# Patient Record
Sex: Male | Born: 1960
Health system: Southern US, Community
[De-identification: ages and names within clinical notes are randomized; demographics above are authoritative.]

## PROBLEM LIST (undated history)

## (undated) DIAGNOSIS — M1990 Unspecified osteoarthritis, unspecified site: Secondary | ICD-10-CM

## (undated) DIAGNOSIS — K4020 Bilateral inguinal hernia, without obstruction or gangrene, not specified as recurrent: Secondary | ICD-10-CM

## (undated) DIAGNOSIS — N529 Male erectile dysfunction, unspecified: Secondary | ICD-10-CM

## (undated) HISTORY — PX: KNEE SURGERY: SHX244

---

## 2016-12-22 DIAGNOSIS — I214 Non-ST elevation (NSTEMI) myocardial infarction: Secondary | ICD-10-CM

## 2016-12-22 HISTORY — DX: Non-ST elevation (NSTEMI) myocardial infarction: I21.4

## 2016-12-31 DIAGNOSIS — I10 Essential (primary) hypertension: Secondary | ICD-10-CM | POA: Insufficient documentation

## 2016-12-31 DIAGNOSIS — E782 Mixed hyperlipidemia: Secondary | ICD-10-CM | POA: Diagnosis not present

## 2016-12-31 DIAGNOSIS — Z7689 Persons encountering health services in other specified circumstances: Secondary | ICD-10-CM | POA: Diagnosis not present

## 2016-12-31 DIAGNOSIS — I2129 ST elevation (STEMI) myocardial infarction involving other sites: Secondary | ICD-10-CM | POA: Insufficient documentation

## 2016-12-31 DIAGNOSIS — I2510 Atherosclerotic heart disease of native coronary artery without angina pectoris: Secondary | ICD-10-CM

## 2016-12-31 HISTORY — DX: Essential (primary) hypertension: I10

## 2016-12-31 HISTORY — DX: ST elevation (STEMI) myocardial infarction involving other sites: I21.29

## 2016-12-31 HISTORY — DX: Atherosclerotic heart disease of native coronary artery without angina pectoris: I25.10

## 2016-12-31 HISTORY — DX: Mixed hyperlipidemia: E78.2

## 2017-01-28 DIAGNOSIS — I2510 Atherosclerotic heart disease of native coronary artery without angina pectoris: Secondary | ICD-10-CM | POA: Diagnosis not present

## 2017-01-28 DIAGNOSIS — E782 Mixed hyperlipidemia: Secondary | ICD-10-CM | POA: Diagnosis not present

## 2017-01-28 DIAGNOSIS — I10 Essential (primary) hypertension: Secondary | ICD-10-CM | POA: Diagnosis not present

## 2017-04-28 DIAGNOSIS — Z0000 Encounter for general adult medical examination without abnormal findings: Secondary | ICD-10-CM | POA: Diagnosis not present

## 2017-04-28 DIAGNOSIS — I10 Essential (primary) hypertension: Secondary | ICD-10-CM | POA: Diagnosis not present

## 2017-04-28 DIAGNOSIS — I2510 Atherosclerotic heart disease of native coronary artery without angina pectoris: Secondary | ICD-10-CM | POA: Diagnosis not present

## 2017-05-08 DIAGNOSIS — I10 Essential (primary) hypertension: Secondary | ICD-10-CM | POA: Diagnosis not present

## 2017-05-08 DIAGNOSIS — Z79899 Other long term (current) drug therapy: Secondary | ICD-10-CM | POA: Diagnosis not present

## 2017-05-26 DIAGNOSIS — R001 Bradycardia, unspecified: Secondary | ICD-10-CM | POA: Diagnosis not present

## 2017-05-26 DIAGNOSIS — I2510 Atherosclerotic heart disease of native coronary artery without angina pectoris: Secondary | ICD-10-CM | POA: Diagnosis not present

## 2017-05-26 DIAGNOSIS — E782 Mixed hyperlipidemia: Secondary | ICD-10-CM | POA: Diagnosis not present

## 2017-05-26 HISTORY — DX: Bradycardia, unspecified: R00.1

## 2017-07-20 ENCOUNTER — Encounter: Payer: Self-pay | Admitting: *Deleted

## 2017-07-20 ENCOUNTER — Encounter: Payer: 59 | Attending: Internal Medicine | Admitting: *Deleted

## 2017-07-20 VITALS — Ht 69.7 in | Wt 197.6 lb

## 2017-07-20 DIAGNOSIS — I252 Old myocardial infarction: Secondary | ICD-10-CM | POA: Diagnosis present

## 2017-07-20 DIAGNOSIS — I214 Non-ST elevation (NSTEMI) myocardial infarction: Secondary | ICD-10-CM

## 2017-07-20 DIAGNOSIS — I219 Acute myocardial infarction, unspecified: Secondary | ICD-10-CM

## 2017-07-20 HISTORY — DX: Acute myocardial infarction, unspecified: I21.9

## 2017-07-20 NOTE — Progress Notes (Signed)
Daily Session Note  Patient Details  Name: Adam Cline MRN: [id number] Date of Birth: February 07, 1961 Referring Provider:  Serafina Royals,  MD  Encounter Date: 07/20/2017  Check In:     Session Check In - 07/20/17 1237      Check-In   Location [facility]Cardiac & Pul[facility]   Staff Present Heath Lark, RN, BSN, Gordy Councilman, MA, ACSM RCEP, Exercise Physiologist;Shiryl Ruddy, RN, BSN   Supervising physician immediately available to respond to emergencies See telemetry face sheet for immediately available ER MD   Medication changes reported     No   Fall or balance concerns reported    No   Tobacco Cessation No Change   Warm-up and Cool-down Performed as group-led instruction   Resistance Training Performed Yes   VAD Patient? No     Pain Assessment   Currently in Pain? No/denies         History  Smoking Status  . Never Smoker  Smokeless Tobacco  . Not on file    Goals Met:  Proper associated with RPD/PD & O2 Sat Exercise tolerated well Personal goals reviewed No report of cardiac concerns or symptoms Strength training completed today  Goals Unmet:  Not Applicable  Comments:    Dr. Emily Filbert is Medical Director for [name].

## 2017-07-20 NOTE — Progress Notes (Deleted)
Cardiac Individual Treatment Plan  Patient Details  Name: Adam Cline MRN: [id number] Date of Birth: [birth year] Referring Provider:     Cardiac Rehab from 07/20/2017 in [facility] Cardiac and Pulmonary Rehab  Referring Provider  Serafina Royals MD      Initial Encounter Date:    Cardiac Rehab from 07/20/2017 in [facility] Cardiac and Pulmonary Rehab  Date  07/20/17  Referring Provider  Serafina Royals MD      Visit Diagnosis: NSTEMI (non-ST elevated myocardial infarction) [facility])  Patient's Home Medications on Admission:  Current Outpatient Prescriptions:  .  aspirin EC 81 MG tablet, 1 tablet once daily., Disp: , Rfl:  .  atorvastatin (LIPITOR) 80 MG tablet, Take by mouth., Disp: , Rfl:  .  fluticasone (FLONASE) 50 MCG/ACT nasal spray, Place into the nose., Disp: , Rfl:  .  lisinopril (PRINIVIL,ZESTRIL) 10 MG tablet, Take by mouth., Disp: , Rfl:  .  metoprolol tartrate (LOPRESSOR) 25 MG tablet, Take by mouth., Disp: , Rfl:  .  nitroGLYCERIN (NITROSTAT) 0.4 MG SL tablet, as needed., Disp: , Rfl:  .  ticagrelor (BRILINTA) 90 MG TABS tablet, Take by mouth., Disp: , Rfl:   Past Medical History: No past medical history on file.  Tobacco Use: History  Smoking Status  . Never Smoker  Smokeless Tobacco  . Not on file    Labs: Recent Review Flowsheet Data    There is no flowsheet data to display.       Exercise Target Goals: Date: 07/20/17  Exercise Program Goal: Individual exercise prescription set with THRR, safety & activity barriers. Participant demonstrates ability to understand and report RPE using BORG scale, to self-measure pulse accurately, and to acknowledge the importance of the exercise prescription.  Exercise Prescription Goal: Starting with aerobic activity 30 plus minutes a day, 3 days per week for initial exercise prescription. Provide home exercise prescription and guidelines that participant acknowledges understanding prior to discharge.  Activity Barriers & Risk  Stratification:     Activity Barriers & Cardiac Risk Stratification - 07/20/17 1240      Activity Barriers & Cardiac Risk Stratification   Activity Barriers Joint Problems;Deconditioning;Muscular Weakness  knee achy, R shoulder limited ROM   Cardiac Risk Stratification High      6 Minute Walk:     6 Minute Walk    Row Name 07/20/17 1411         6 Minute Walk   Phase Initial     Distance 1268 feet     Walk Time 6 minutes     # of Rest Breaks 0     MPH 2.4     METS 3.8     RPE 9     VO2 Peak 13.3     Symptoms No     Resting HR 60 bpm     Resting BP 134/60     Max Ex. HR 99 bpm     Max Ex. BP 162/74     2 Minute Post BP 126/70        Oxygen Initial Assessment:   Oxygen Re-Evaluation:   Oxygen Discharge (Final Oxygen Re-Evaluation):   Initial Exercise Prescription:     Initial Exercise Prescription - 07/20/17 1400      Date of Initial Exercise RX and Referring Provider   Date 07/20/17   Referring Provider Serafina Royals MD     Treadmill   MPH 3   Grade 1   Minutes 15   METs 3.71     REL-XR  Level 2   Speed 50   Minutes 15   METs 2     T5 Nustep   Level 3   SPM 80   Minutes 15   METs 2     Prescription Details   Frequency (times per week) 3   Duration Progress to 45 minutes of aerobic exercise without signs/symptoms of physical distress     Intensity   THRR 40-80% of Max Heartrate 102-143   Ratings of Perceived Exertion 11-13   Perceived Dyspnea 0-4     Progression   Progression Continue to progress workloads to maintain intensity without signs/symptoms of physical distress.     Resistance Training   Training Prescription Yes   Weight 4 lbs   Reps 10-15      Perform Capillary Blood Glucose checks as needed.  Exercise Prescription Changes:     Exercise Prescription Changes    Row Name 07/20/17 1400             Response to Exercise   Blood Pressure (Admit) 134/60       Blood Pressure (Exercise) 162/74       Blood  Pressure (Exit) 126/70       Heart Rate (Admit) 60 bpm       Heart Rate (Exercise) 99 bpm       Heart Rate (Exit) 62 bpm       Oxygen Saturation (Admit) 100 %       Oxygen Saturation (Exercise) 100 %       Rating of Perceived Exertion (Exercise) 9       Symptoms none       Comments walk test results          Exercise Comments:   Exercise Goals and Review:     Exercise Goals    Row Name 07/20/17 1414             Exercise Goals   Increase Physical Activity Yes       Intervention Provide advice, education, support and counseling about physical activity/exercise needs.;Develop an individualized exercise prescription for aerobic and resistive training based on initial evaluation findings, risk stratification, comorbidities and participant's personal goals.       Expected Outcomes Achievement of increased cardiorespiratory fitness and enhanced flexibility, muscular endurance and strength shown through measurements of functional capacity and personal statement of participant.       Increase Strength and Stamina Yes       Intervention Provide advice, education, support and counseling about physical activity/exercise needs.;Develop an individualized exercise prescription for aerobic and resistive training based on initial evaluation findings, risk stratification, comorbidities and participant's personal goals.       Expected Outcomes Achievement of increased cardiorespiratory fitness and enhanced flexibility, muscular endurance and strength shown through measurements of functional capacity and personal statement of participant.          Exercise Goals Re-Evaluation :   Discharge Exercise Prescription (Final Exercise Prescription Changes):     Exercise Prescription Changes - 07/20/17 1400      Response to Exercise   Blood Pressure (Admit) 134/60   Blood Pressure (Exercise) 162/74   Blood Pressure (Exit) 126/70   Heart Rate (Admit) 60 bpm   Heart Rate (Exercise) 99 bpm   Heart  Rate (Exit) 62 bpm   Oxygen Saturation (Admit) 100 %   Oxygen Saturation (Exercise) 100 %   Rating of Perceived Exertion (Exercise) 9   Symptoms none   Comments walk test results  Nutrition:  Target Goals: Understanding of nutrition guidelines, daily intake of sodium 1500mg , cholesterol 200mg , calories 30% from fat and 7% or less from saturated fats, daily to have 5 or more servings of fruits and vegetables.  Biometrics:      Post Biometrics - 07/20/17 1414       Post  Biometrics   Height 5' 9.7" (1.77 m)   Weight 197 lb 9.6 oz (89.6 kg)   Waist Circumference 38.5 inches   Hip Circumference 40 inches   Waist to Hip Ratio 0.96 %   BMI (Calculated) 28.7   Single Leg Stand 30 seconds      Nutrition Therapy Plan and Nutrition Goals:     Nutrition Therapy & Goals - 07/20/17 1240      Nutrition Therapy   Drug/Food Interactions Statins/Certain Fruits     Intervention Plan   Intervention Prescribe, educate and counsel regarding individualized specific dietary modifications aiming towards targeted core components such as weight, hypertension, lipid management, diabetes, heart failure and other comorbidities.  Cardiac Rehab Registered Dietician appt made.    Expected Outcomes Short Term Goal: Understand basic principles of dietary content, such as calories, fat, sodium, cholesterol and nutrients.      Nutrition Discharge: Rate Your Plate Scores:     Nutrition Assessments - 07/20/17 1246      MEDFICTS Scores   Pre Score 43      Nutrition Goals Re-Evaluation:   Nutrition Goals Discharge (Final Nutrition Goals Re-Evaluation):   Psychosocial: Target Goals: Acknowledge presence or absence of significant depression and/or stress, maximize coping skills, provide positive support system. Participant is able to verbalize types and ability to use techniques and skills needed for reducing stress and depression.   Initial Review & Psychosocial Screening:     Initial  Psych Review & Screening - 07/20/17 1242      Initial Review   Current issues with Current Stress Concerns   Source of Stress Concerns Occupation   Comments "Plant Manager/Machinist"      Quality of Life Scores:      Quality of Life - 07/20/17 1242      Quality of Life Scores   Health/Function Pre 22.48 %   Socioeconomic Pre 25.92 %   Psych/Spiritual Pre 24.64 %   Family Pre 27.88 %   GLOBAL Pre 24.37 %      PHQ-9: Recent Review Flowsheet Data    Depression screen [facility] 2/9 07/20/2017   Decreased Interest 0   Down, Depressed, Hopeless 0   PHQ - 2 Score 0   Altered sleeping 0   Tired, decreased energy 1   Change in appetite 0   Feeling bad or failure about yourself  0   Trouble concentrating 0   Moving slowly or fidgety/restless 0   Suicidal thoughts 0   PHQ-9 Score 1   Difficult doing work/chores Not difficult at all     Interpretation of Total Score  Total Score Depression Severity:  1-4 = Minimal depression, 5-9 = Mild depression, 10-14 = Moderate depression, 15-19 = Moderately severe depression, 20-27 = Severe depression   Psychosocial Evaluation and Intervention:   Psychosocial Re-Evaluation:   Psychosocial Discharge (Final Psychosocial Re-Evaluation):   Vocational Rehabilitation: Provide vocational rehab assistance to qualifying candidates.   Vocational Rehab Evaluation & Intervention:     Vocational Rehab - 07/20/17 1237      Initial Vocational Rehab Evaluation & Intervention   Assessment shows need for Vocational Rehabilitation No      Education: Education  Goals: Education classes will be provided on a weekly basis, covering required topics. Participant will state understanding/return demonstration of topics presented.  Learning Barriers/Preferences:     Learning Barriers/Preferences - 07/20/17 1237      Learning Barriers/Preferences   Learning Barriers None   Learning Preferences None      Education Topics: General Nutrition  Guidelines/Fats and Fiber: -Group instruction provided by verbal, written material, models and posters to present the general guidelines for heart healthy nutrition. Gives an explanation and review of dietary fats and fiber.   Controlling Sodium/Reading Food Labels: -Group verbal and written material supporting the discussion of sodium use in heart healthy nutrition. Review and explanation with models, verbal and written materials for utilization of the food label.   Exercise Physiology & Risk Factors: - Group verbal and written instruction with models to review the exercise physiology of the cardiovascular system and associated critical values. Details cardiovascular disease risk factors and the goals associated with each risk factor.   Aerobic Exercise & Resistance Training: - Gives group verbal and written discussion on the health impact of inactivity. On the components of aerobic and resistive training programs and the benefits of this training and how to safely progress through these programs.   Flexibility, Balance, General Exercise Guidelines: - Provides group verbal and written instruction on the benefits of flexibility and balance training programs. Provides general exercise guidelines with specific guidelines to those with heart or lung disease. Demonstration and skill practice provided.   Stress Management: - Provides group verbal and written instruction about the health risks of elevated stress, cause of high stress, and healthy ways to reduce stress.   Depression: - Provides group verbal and written instruction on the correlation between heart/lung disease and depressed mood, treatment options, and the stigmas associated with seeking treatment.   Anatomy & Physiology of the Heart: - Group verbal and written instruction and models provide basic cardiac anatomy and physiology, with the coronary electrical and arterial systems. Review of: AMI, Angina, Valve disease, Heart  Failure, Cardiac Arrhythmia, Pacemakers, and the ICD.   Cardiac Procedures: - Group verbal and written instruction and models to describe the testing methods done to diagnose heart disease. Reviews the outcomes of the test results. Describes the treatment choices: Medical Management, Angioplasty, or Coronary Bypass Surgery.   Cardiac Medications: - Group verbal and written instruction to review commonly prescribed medications for heart disease. Reviews the medication, class of the drug, and side effects. Includes the steps to properly store meds and maintain the prescription regimen.   Go Sex-Intimacy & Heart Disease, Get SMART - Goal Setting: - Group verbal and written instruction through game format to discuss heart disease and the return to sexual intimacy. Provides group verbal and written material to discuss and apply goal setting through the application of the S.M.A.R.T. Method.   Other Matters of the Heart: - Provides group verbal, written materials and models to describe Heart Failure, Angina, Valve Disease, and Diabetes in the realm of heart disease. Includes description of the disease process and treatment options available to the cardiac patient.   Exercise & Equipment Safety: - Individual verbal instruction and demonstration of equipment use and safety with use of the equipment.   Cardiac Rehab from 07/20/2017 in [facility] Cardiac and Pulmonary Rehab  Date  07/20/17  Educator  C. [place]  Instruction Review Code  1- partially meets, needs review/practice      Infection Prevention: - Provides verbal and written material to individual with discussion of infection  control including proper hand washing and proper equipment cleaning during exercise session.   Cardiac Rehab from 07/20/2017 in [facility] Cardiac and Pulmonary Rehab  Date  07/20/17  Educator  C. [place]  Instruction Review Code  1- partially meets, needs review/practice      Falls Prevention: - Provides verbal and  written material to individual with discussion of falls prevention and safety.   Cardiac Rehab from 07/20/2017 in [facility] Cardiac and Pulmonary Rehab  Date  07/20/17  Educator  C. [place]  Instruction Review Code  1- partially meets, needs review/practice      Diabetes: - Individual verbal and written instruction to review signs/symptoms of diabetes, desired ranges of glucose level fasting, after meals and with exercise. Advice that pre and post exercise glucose checks will be done for 3 sessions at entry of program.    Knowledge Questionnaire Score:     Knowledge Questionnaire Score - 07/20/17 1237      Knowledge Questionnaire Score   Pre Score 24/28      Core Components/Risk Factors/Patient Goals at Admission:     Personal Goals and Risk Factors at Admission - 07/20/17 1241      Core Components/Risk Factors/Patient Goals on Admission    Weight Management Weight Loss;Yes   Intervention Weight Management: Develop a combined nutrition and exercise program designed to reach desired caloric intake, while maintaining appropriate intake of nutrient and fiber, sodium and fats, and appropriate energy expenditure required for the weight goal.;Weight Management: Provide education and appropriate resources to help participant work on and attain dietary goals.   Admit Weight 197 lb 9.6 oz (89.6 kg)   Goal Weight: Short Term 192 lb (87.1 kg)   Goal Weight: Long Term 185 lb (83.9 kg)   Expected Outcomes Short Term: Continue to assess and modify interventions until short term weight is achieved;Weight Loss: Understanding of general recommendations for a balanced deficit meal plan, which promotes 1-2 lb weight loss per week and includes a negative energy balance of [id number] kcal/d;Long Term: Adherence to nutrition and physical activity/exercise program aimed toward attainment of established weight goal;Understanding recommendations for meals to include 15-35% energy as protein, 25-35% energy from  fat, 35-60% energy from carbohydrates, less than 200mg  of dietary cholesterol, 20-35 gm of total fiber daily;Understanding of distribution of calorie intake throughout the day with the consumption of 4-5 meals/snacks   Hypertension Yes   Intervention Provide education on lifestyle modifcations including regular physical activity/exercise, weight management, moderate sodium restriction and increased consumption of fresh fruit, vegetables, and low fat dairy, alcohol moderation, and smoking cessation.;Monitor prescription use compliance.   Expected Outcomes Short Term: Continued assessment and intervention until BP is < 140/53mm HG in hypertensive participants. < 130/35mm HG in hypertensive participants with diabetes, heart failure or chronic kidney disease.;Long Term: Maintenance of blood pressure at goal levels.   Lipids Yes   Intervention Provide education and support for participant on nutrition & aerobic/resistive exercise along with prescribed medications to achieve LDL 70mg , HDL >40mg .   Expected Outcomes Long Term: Cholesterol controlled with medications as prescribed, with individualized exercise RX and with personalized nutrition plan. Value goals: LDL < 70mg , HDL > 40 mg.;Short Term: Participant states understanding of desired cholesterol values and is compliant with medications prescribed. Participant is following exercise prescription and nutrition guidelines.   Stress Yes   Intervention Offer individual and/or small group education and counseling on adjustment to heart disease, stress management and health-related lifestyle change. Teach and support self-help strategies.;Refer participants experiencing significant psychosocial distress  to appropriate mental health specialists for further evaluation and treatment. When possible, include family members and significant others in education/counseling sessions.   Expected Outcomes Short Term: Participant demonstrates changes in health-related  behavior, relaxation and other stress management skills, ability to obtain effective social support, and compliance with psychotropic medications if prescribed.;Long Term: Emotional wellbeing is indicated by absence of clinically significant psychosocial distress or social isolation.      Core Components/Risk Factors/Patient Goals Review:    Core Components/Risk Factors/Patient Goals at Discharge (Final Review):    ITP Comments:     ITP Comments    Row Name 07/20/17 1238           ITP Comments ITP Created during Medical review after Cardiac Rehab informed consent was signed today at 10:22am. NSTEMI diagnosis documented in 12/23/2017 Medical Record note from [facility] in [place] where Camdon had his heart attack.           Comments: Initial ITP

## 2017-07-20 NOTE — Progress Notes (Signed)
Cardiac Individual Treatment Plan  Patient Details  Name: Adam Cline MRN: [id number] Date of Birth: 03-Jul-1961 Referring Provider:     Cardiac Rehab from 07/20/2017 in [facility] Cardiac and Pulmonary Rehab  Referring Provider  Serafina Royals MD      Initial Encounter Date:    Cardiac Rehab from 07/20/2017 in [facility] Cardiac and Pulmonary Rehab  Date  07/20/17  Referring Provider  Serafina Royals MD      Visit Diagnosis: NSTEMI (non-ST elevated myocardial infarction) [facility])  Patient's Home Medications on Admission:  Current Outpatient Prescriptions:  .  aspirin EC 81 MG tablet, 1 tablet once daily., Disp: , Rfl:  .  atorvastatin (LIPITOR) 80 MG tablet, Take by mouth., Disp: , Rfl:  .  fluticasone (FLONASE) 50 MCG/ACT nasal spray, Place into the nose., Disp: , Rfl:  .  lisinopril (PRINIVIL,ZESTRIL) 10 MG tablet, Take by mouth., Disp: , Rfl:  .  metoprolol tartrate (LOPRESSOR) 25 MG tablet, Take by mouth., Disp: , Rfl:  .  nitroGLYCERIN (NITROSTAT) 0.4 MG SL tablet, as needed., Disp: , Rfl:  .  ticagrelor (BRILINTA) 90 MG TABS tablet, Take by mouth., Disp: , Rfl:   Past Medical History: No past medical history on file.  Tobacco Use: History  Smoking Status  . Never Smoker  Smokeless Tobacco  . Not on file    Labs: Recent Review Flowsheet Data    There is no flowsheet data to display.       Exercise Target Goals: Date: 07/20/17  Exercise Program Goal: Individual exercise prescription set with THRR, safety & activity barriers. Participant demonstrates ability to understand and report RPE using BORG scale, to self-measure pulse accurately, and to acknowledge the importance of the exercise prescription.  Exercise Prescription Goal: Starting with aerobic activity 30 plus minutes a day, 3 days per week for initial exercise prescription. Provide home exercise prescription and guidelines that participant acknowledges understanding prior to discharge.  Activity Barriers & Risk  Stratification:     Activity Barriers & Cardiac Risk Stratification - 07/20/17 1240      Activity Barriers & Cardiac Risk Stratification   Activity Barriers Joint Problems;Deconditioning;Muscular Weakness  knee achy, R shoulder limited ROM   Cardiac Risk Stratification High      6 Minute Walk:     6 Minute Walk    Row Name 07/20/17 1411         6 Minute Walk   Phase Initial     Distance 1268 feet     Walk Time 6 minutes     # of Rest Breaks 0     MPH 2.4     METS 3.8     RPE 9     VO2 Peak 13.3     Symptoms No     Resting HR 60 bpm     Resting BP 134/60     Max Ex. HR 99 bpm     Max Ex. BP 162/74     2 Minute Post BP 126/70        Oxygen Initial Assessment:   Oxygen Re-Evaluation:   Oxygen Discharge (Final Oxygen Re-Evaluation):   Initial Exercise Prescription:     Initial Exercise Prescription - 07/20/17 1400      Date of Initial Exercise RX and Referring Provider   Date 07/20/17   Referring Provider Serafina Royals MD     Treadmill   MPH 3   Grade 1   Minutes 15   METs 3.71     REL-XR  Level 2   Speed 50   Minutes 15   METs 2     T5 Nustep   Level 3   SPM 80   Minutes 15   METs 2     Prescription Details   Frequency (times per week) 3   Duration Progress to 45 minutes of aerobic exercise without signs/symptoms of physical distress     Intensity   THRR 40-80% of Max Heartrate 102-143   Ratings of Perceived Exertion 11-13   Perceived Dyspnea 0-4     Progression   Progression Continue to progress workloads to maintain intensity without signs/symptoms of physical distress.     Resistance Training   Training Prescription Yes   Weight 4 lbs   Reps 10-15      Perform Capillary Blood Glucose checks as needed.  Exercise Prescription Changes:      Exercise Prescription Changes    Row Name 07/20/17 1400             Response to Exercise   Blood Pressure (Admit) 134/60       Blood Pressure (Exercise) 162/74       Blood  Pressure (Exit) 126/70       Heart Rate (Admit) 60 bpm       Heart Rate (Exercise) 99 bpm       Heart Rate (Exit) 62 bpm       Oxygen Saturation (Admit) 100 %       Oxygen Saturation (Exercise) 100 %       Rating of Perceived Exertion (Exercise) 9       Symptoms none       Comments walk test results          Exercise Comments:   Exercise Goals and Review:      Exercise Goals    Row Name 07/20/17 1414             Exercise Goals   Increase Physical Activity Yes       Intervention Provide advice, education, support and counseling about physical activity/exercise needs.;Develop an individualized exercise prescription for aerobic and resistive training based on initial evaluation findings, risk stratification, comorbidities and participant's personal goals.       Expected Outcomes Achievement of increased cardiorespiratory fitness and enhanced flexibility, muscular endurance and strength shown through measurements of functional capacity and personal statement of participant.       Increase Strength and Stamina Yes       Intervention Provide advice, education, support and counseling about physical activity/exercise needs.;Develop an individualized exercise prescription for aerobic and resistive training based on initial evaluation findings, risk stratification, comorbidities and participant's personal goals.       Expected Outcomes Achievement of increased cardiorespiratory fitness and enhanced flexibility, muscular endurance and strength shown through measurements of functional capacity and personal statement of participant.          Exercise Goals Re-Evaluation :   Discharge Exercise Prescription (Final Exercise Prescription Changes):     Exercise Prescription Changes - 07/20/17 1400      Response to Exercise   Blood Pressure (Admit) 134/60   Blood Pressure (Exercise) 162/74   Blood Pressure (Exit) 126/70   Heart Rate (Admit) 60 bpm   Heart Rate (Exercise) 99 bpm   Heart  Rate (Exit) 62 bpm   Oxygen Saturation (Admit) 100 %   Oxygen Saturation (Exercise) 100 %   Rating of Perceived Exertion (Exercise) 9   Symptoms none   Comments walk test results  Nutrition:  Target Goals: Understanding of nutrition guidelines, daily intake of sodium 1500mg , cholesterol 200mg , calories 30% from fat and 7% or less from saturated fats, daily to have 5 or more servings of fruits and vegetables.  Biometrics:      Post Biometrics - 07/20/17 1414       Post  Biometrics   Height 5' 9.7" (1.77 m)   Weight 197 lb 9.6 oz (89.6 kg)   Waist Circumference 38.5 inches   Hip Circumference 40 inches   Waist to Hip Ratio 0.96 %   BMI (Calculated) 28.7   Single Leg Stand 30 seconds      Nutrition Therapy Plan and Nutrition Goals:     Nutrition Therapy & Goals - 07/20/17 1240      Nutrition Therapy   Drug/Food Interactions Statins/Certain Fruits     Intervention Plan   Intervention Prescribe, educate and counsel regarding individualized specific dietary modifications aiming towards targeted core components such as weight, hypertension, lipid management, diabetes, heart failure and other comorbidities.  Cardiac Rehab Registered Dietician appt made.    Expected Outcomes Short Term Goal: Understand basic principles of dietary content, such as calories, fat, sodium, cholesterol and nutrients.      Nutrition Discharge: Rate Your Plate Scores:     Nutrition Assessments - 07/20/17 1246      MEDFICTS Scores   Pre Score 43      Nutrition Goals Re-Evaluation:   Nutrition Goals Discharge (Final Nutrition Goals Re-Evaluation):   Psychosocial: Target Goals: Acknowledge presence or absence of significant depression and/or stress, maximize coping skills, provide positive support system. Participant is able to verbalize types and ability to use techniques and skills needed for reducing stress and depression.   Initial Review & Psychosocial Screening:     Initial  Psych Review & Screening - 07/20/17 1242      Initial Review   Current issues with Current Stress Concerns   Source of Stress Concerns Occupation   Comments "Plant Manager/Machinist"      Quality of Life Scores:      Quality of Life - 07/20/17 1242      Quality of Life Scores   Health/Function Pre 22.48 %   Socioeconomic Pre 25.92 %   Psych/Spiritual Pre 24.64 %   Family Pre 27.88 %   GLOBAL Pre 24.37 %      PHQ-9: Recent Review Flowsheet Data    Depression screen [facility] 2/9 07/20/2017   Decreased Interest 0   Down, Depressed, Hopeless 0   PHQ - 2 Score 0   Altered sleeping 0   Tired, decreased energy 1   Change in appetite 0   Feeling bad or failure about yourself  0   Trouble concentrating 0   Moving slowly or fidgety/restless 0   Suicidal thoughts 0   PHQ-9 Score 1   Difficult doing work/chores Not difficult at all     Interpretation of Total Score  Total Score Depression Severity:  1-4 = Minimal depression, 5-9 = Mild depression, 10-14 = Moderate depression, 15-19 = Moderately severe depression, 20-27 = Severe depression   Psychosocial Evaluation and Intervention:   Psychosocial Re-Evaluation:   Psychosocial Discharge (Final Psychosocial Re-Evaluation):   Vocational Rehabilitation: Provide vocational rehab assistance to qualifying candidates.   Vocational Rehab Evaluation & Intervention:     Vocational Rehab - 07/20/17 1237      Initial Vocational Rehab Evaluation & Intervention   Assessment shows need for Vocational Rehabilitation No      Education: Education  Goals: Education classes will be provided on a weekly basis, covering required topics. Participant will state understanding/return demonstration of topics presented.  Learning Barriers/Preferences:     Learning Barriers/Preferences - 07/20/17 1237      Learning Barriers/Preferences   Learning Barriers None   Learning Preferences None      Education Topics: General Nutrition  Guidelines/Fats and Fiber: -Group instruction provided by verbal, written material, models and posters to present the general guidelines for heart healthy nutrition. Gives an explanation and review of dietary fats and fiber.   Controlling Sodium/Reading Food Labels: -Group verbal and written material supporting the discussion of sodium use in heart healthy nutrition. Review and explanation with models, verbal and written materials for utilization of the food label.   Exercise Physiology & Risk Factors: - Group verbal and written instruction with models to review the exercise physiology of the cardiovascular system and associated critical values. Details cardiovascular disease risk factors and the goals associated with each risk factor.   Aerobic Exercise & Resistance Training: - Gives group verbal and written discussion on the health impact of inactivity. On the components of aerobic and resistive training programs and the benefits of this training and how to safely progress through these programs.   Flexibility, Balance, General Exercise Guidelines: - Provides group verbal and written instruction on the benefits of flexibility and balance training programs. Provides general exercise guidelines with specific guidelines to those with heart or lung disease. Demonstration and skill practice provided.   Stress Management: - Provides group verbal and written instruction about the health risks of elevated stress, cause of high stress, and healthy ways to reduce stress.   Depression: - Provides group verbal and written instruction on the correlation between heart/lung disease and depressed mood, treatment options, and the stigmas associated with seeking treatment.   Anatomy & Physiology of the Heart: - Group verbal and written instruction and models provide basic cardiac anatomy and physiology, with the coronary electrical and arterial systems. Review of: AMI, Angina, Valve disease, Heart  Failure, Cardiac Arrhythmia, Pacemakers, and the ICD.   Cardiac Procedures: - Group verbal and written instruction and models to describe the testing methods done to diagnose heart disease. Reviews the outcomes of the test results. Describes the treatment choices: Medical Management, Angioplasty, or Coronary Bypass Surgery.   Cardiac Medications: - Group verbal and written instruction to review commonly prescribed medications for heart disease. Reviews the medication, class of the drug, and side effects. Includes the steps to properly store meds and maintain the prescription regimen.   Go Sex-Intimacy & Heart Disease, Get SMART - Goal Setting: - Group verbal and written instruction through game format to discuss heart disease and the return to sexual intimacy. Provides group verbal and written material to discuss and apply goal setting through the application of the S.M.A.R.T. Method.   Other Matters of the Heart: - Provides group verbal, written materials and models to describe Heart Failure, Angina, Valve Disease, and Diabetes in the realm of heart disease. Includes description of the disease process and treatment options available to the cardiac patient.   Exercise & Equipment Safety: - Individual verbal instruction and demonstration of equipment use and safety with use of the equipment.   Cardiac Rehab from 07/20/2017 in [facility] Cardiac and Pulmonary Rehab  Date  07/20/17  Educator  C. [place]  Instruction Review Code  1- partially meets, needs review/practice      Infection Prevention: - Provides verbal and written material to individual with discussion of infection  control including proper hand washing and proper equipment cleaning during exercise session.   Cardiac Rehab from 07/20/2017 in [facility] Cardiac and Pulmonary Rehab  Date  07/20/17  Educator  C. [place]  Instruction Review Code  1- partially meets, needs review/practice      Falls Prevention: - Provides verbal and  written material to individual with discussion of falls prevention and safety.   Cardiac Rehab from 07/20/2017 in [facility] Cardiac and Pulmonary Rehab  Date  07/20/17  Educator  C. [place]  Instruction Review Code  1- partially meets, needs review/practice      Diabetes: - Individual verbal and written instruction to review signs/symptoms of diabetes, desired ranges of glucose level fasting, after meals and with exercise. Advice that pre and post exercise glucose checks will be done for 3 sessions at entry of program.    Knowledge Questionnaire Score:     Knowledge Questionnaire Score - 07/20/17 1237      Knowledge Questionnaire Score   Pre Score 24/28      Core Components/Risk Factors/Patient Goals at Admission:     Personal Goals and Risk Factors at Admission - 07/20/17 1241      Core Components/Risk Factors/Patient Goals on Admission    Weight Management Weight Loss;Yes   Intervention Weight Management: Develop a combined nutrition and exercise program designed to reach desired caloric intake, while maintaining appropriate intake of nutrient and fiber, sodium and fats, and appropriate energy expenditure required for the weight goal.;Weight Management: Provide education and appropriate resources to help participant work on and attain dietary goals.   Admit Weight 197 lb 9.6 oz (89.6 kg)   Goal Weight: Short Term 192 lb (87.1 kg)   Goal Weight: Long Term 185 lb (83.9 kg)   Expected Outcomes Short Term: Continue to assess and modify interventions until short term weight is achieved;Weight Loss: Understanding of general recommendations for a balanced deficit meal plan, which promotes 1-2 lb weight loss per week and includes a negative energy balance of [phone] kcal/d;Long Term: Adherence to nutrition and physical activity/exercise program aimed toward attainment of established weight goal;Understanding recommendations for meals to include 15-35% energy as protein, 25-35% energy from  fat, 35-60% energy from carbohydrates, less than 200mg  of dietary cholesterol, 20-35 gm of total fiber daily;Understanding of distribution of calorie intake throughout the day with the consumption of 4-5 meals/snacks   Hypertension Yes   Intervention Provide education on lifestyle modifcations including regular physical activity/exercise, weight management, moderate sodium restriction and increased consumption of fresh fruit, vegetables, and low fat dairy, alcohol moderation, and smoking cessation.;Monitor prescription use compliance.   Expected Outcomes Short Term: Continued assessment and intervention until BP is < 140/57mm HG in hypertensive participants. < 130/55mm HG in hypertensive participants with diabetes, heart failure or chronic kidney disease.;Long Term: Maintenance of blood pressure at goal levels.   Lipids Yes   Intervention Provide education and support for participant on nutrition & aerobic/resistive exercise along with prescribed medications to achieve LDL 70mg , HDL >40mg .   Expected Outcomes Long Term: Cholesterol controlled with medications as prescribed, with individualized exercise RX and with personalized nutrition plan. Value goals: LDL < 70mg , HDL > 40 mg.;Short Term: Participant states understanding of desired cholesterol values and is compliant with medications prescribed. Participant is following exercise prescription and nutrition guidelines.   Stress Yes   Intervention Offer individual and/or small group education and counseling on adjustment to heart disease, stress management and health-related lifestyle change. Teach and support self-help strategies.;Refer participants experiencing significant psychosocial distress  to appropriate mental health specialists for further evaluation and treatment. When possible, include family members and significant others in education/counseling sessions.   Expected Outcomes Short Term: Participant demonstrates changes in health-related  behavior, relaxation and other stress management skills, ability to obtain effective social support, and compliance with psychotropic medications if prescribed.;Long Term: Emotional wellbeing is indicated by absence of clinically significant psychosocial distress or social isolation.      Core Components/Risk Factors/Patient Goals Review:    Core Components/Risk Factors/Patient Goals at Discharge (Final Review):    ITP Comments:     ITP Comments    Row Name 07/20/17 1238           ITP Comments ITP Created during Medical review after Cardiac Rehab informed consent was signed today at 10:22am. NSTEMI diagnosis documented in 12/23/2017 Medical Record note from [facility] in [place] where Edis had his heart attack.           Comments: Ready to start Cardiac Rehab

## 2017-07-20 NOTE — Patient Instructions (Addendum)
Patient Instructions  Patient Details  Name: Adam Cline MRN: [id number] Date of Birth: 1961/06/15 Referring Provider:  Corey Skains, MD  Below are the personal goals you chose as well as exercise and nutrition goals. Our goal is to help you keep on track towards obtaining and maintaining your goals. We will be discussing your progress on these goals with you throughout the program.  Initial Exercise Prescription:     Initial Exercise Prescription - 07/20/17 1400      Date of Initial Exercise RX and Referring Provider   Date 07/20/17   Referring Provider Serafina Royals MD     Treadmill   MPH 3   Grade 1   Minutes 15   METs 3.71     REL-XR   Level 2   Speed 50   Minutes 15   METs 2     T5 Nustep   Level 3   SPM 80   Minutes 15   METs 2     Prescription Details   Frequency (times per week) 3   Duration Progress to 45 minutes of aerobic exercise without signs/symptoms of physical distress     Intensity   THRR 40-80% of Max Heartrate 102-143   Ratings of Perceived Exertion 11-13   Perceived Dyspnea 0-4     Progression   Progression Continue to progress workloads to maintain intensity without signs/symptoms of physical distress.     Resistance Training   Training Prescription Yes   Weight 4 lbs   Reps 10-15      Exercise Goals: Frequency: Be able to perform aerobic exercise three times per week working toward 3-5 days per week.  Intensity: Work with a perceived exertion of 11 (fairly light) - 15 (hard) as tolerated. Follow your new exercise prescription and watch for changes in prescription as you progress with the program. Changes will be reviewed with you when they are made.  Duration: You should be able to do 30 minutes of continuous aerobic exercise in addition to a 5 minute warm-up and a 5 minute cool-down routine.  Nutrition Goals: Your personal nutrition goals will be established when you do your nutrition analysis with the dietician.  The  following are nutrition guidelines to follow: Cholesterol < 200mg /day Sodium < 1500mg /day Fiber: Men over 50 yrs - 30 grams per day  Personal Goals:     Personal Goals and Risk Factors at Admission - 07/20/17 1241      Core Components/Risk Factors/Patient Goals on Admission    Weight Management Weight Loss;Yes   Intervention Weight Management: Develop a combined nutrition and exercise program designed to reach desired caloric intake, while maintaining appropriate intake of nutrient and fiber, sodium and fats, and appropriate energy expenditure required for the weight goal.;Weight Management: Provide education and appropriate resources to help participant work on and attain dietary goals.   Admit Weight 197 lb 9.6 oz (89.6 kg)   Goal Weight: Short Term 192 lb (87.1 kg)   Goal Weight: Long Term 185 lb (83.9 kg)   Expected Outcomes Short Term: Continue to assess and modify interventions until short term weight is achieved;Weight Loss: Understanding of general recommendations for a balanced deficit meal plan, which promotes 1-2 lb weight loss per week and includes a negative energy balance of [phone] kcal/d;Long Term: Adherence to nutrition and physical activity/exercise program aimed toward attainment of established weight goal;Understanding recommendations for meals to include 15-35% energy as protein, 25-35% energy from fat, 35-60% energy from carbohydrates, less than 200mg  of dietary  cholesterol, 20-35 gm of total fiber daily;Understanding of distribution of calorie intake throughout the day with the consumption of 4-5 meals/snacks   Hypertension Yes   Intervention Provide education on lifestyle modifcations including regular physical activity/exercise, weight management, moderate sodium restriction and increased consumption of fresh fruit, vegetables, and low fat dairy, alcohol moderation, and smoking cessation.;Monitor prescription use compliance.   Expected Outcomes Short Term: Continued  assessment and intervention until BP is < 140/87mm HG in hypertensive participants. < 130/42mm HG in hypertensive participants with diabetes, heart failure or chronic kidney disease.;Long Term: Maintenance of blood pressure at goal levels.   Lipids Yes   Intervention Provide education and support for participant on nutrition & aerobic/resistive exercise along with prescribed medications to achieve LDL 70mg , HDL >40mg .   Expected Outcomes Long Term: Cholesterol controlled with medications as prescribed, with individualized exercise RX and with personalized nutrition plan. Value goals: LDL < 70mg , HDL > 40 mg.;Short Term: Participant states understanding of desired cholesterol values and is compliant with medications prescribed. Participant is following exercise prescription and nutrition guidelines.   Stress Yes   Intervention Offer individual and/or small group education and counseling on adjustment to heart disease, stress management and health-related lifestyle change. Teach and support self-help strategies.;Refer participants experiencing significant psychosocial distress to appropriate mental health specialists for further evaluation and treatment. When possible, include family members and significant others in education/counseling sessions.   Expected Outcomes Short Term: Participant demonstrates changes in health-related behavior, relaxation and other stress management skills, ability to obtain effective social support, and compliance with psychotropic medications if prescribed.;Long Term: Emotional wellbeing is indicated by absence of clinically significant psychosocial distress or social isolation.      Tobacco Use Initial Evaluation: History  Smoking Status  . Never Smoker  Smokeless Tobacco  . Not on file    Copy of goals given to participant.

## 2017-07-22 ENCOUNTER — Encounter: Payer: 59 | Admitting: *Deleted

## 2017-07-22 DIAGNOSIS — I252 Old myocardial infarction: Secondary | ICD-10-CM | POA: Diagnosis not present

## 2017-07-22 DIAGNOSIS — I214 Non-ST elevation (NSTEMI) myocardial infarction: Secondary | ICD-10-CM

## 2017-07-22 NOTE — Progress Notes (Signed)
Daily Session Note  Patient Details  Name: Adam Cline MRN: [id number] Date of Birth: [birth year] Referring Provider:     Cardiac Rehab from 07/20/2017 in [facility] Cardiac and Pulmonary Rehab  Referring Provider  Serafina Royals MD      Encounter Date: 07/22/2017  Check In:     Session Check In - 07/22/17 1627      Check-In   Location [facility]-Cardiac & Pulmonary Rehab   Staff Present Renita Papa, RN Vickki Hearing, BA, ACSM CEP, Exercise Physiologist;Carroll Enterkin, RN, BSN   Supervising physician immediately available to respond to emergencies See telemetry face sheet for immediately available ER MD   Medication changes reported     No   Fall or balance concerns reported    No   Warm-up and Cool-down Performed on first and last piece of equipment   Resistance Training Performed Yes   VAD Patient? No     Pain Assessment   Currently in Pain? No/denies         History  Smoking Status  . Never Smoker  Smokeless Tobacco  . Not on file    Goals Met:  Proper associated with RPD/PD & O2 Sat Independence with exercise equipment Exercise tolerated well No report of cardiac concerns or symptoms Strength training completed today  Goals Unmet:  Not Applicable  Comments: First full day of exercise!  Patient was oriented to gym and equipment including functions, settings, policies, and procedures.  Patient's individual exercise prescription and treatment plan were reviewed.  All starting workloads were established based on the results of the 6 minute walk test done at initial orientation visit.  The plan for exercise progression was also introduced and progression will be customized based on patient's performance and goals.    Dr. Emily Filbert is Medical Director for [place] and [name].

## 2017-07-23 DIAGNOSIS — I252 Old myocardial infarction: Secondary | ICD-10-CM | POA: Diagnosis not present

## 2017-07-23 DIAGNOSIS — I214 Non-ST elevation (NSTEMI) myocardial infarction: Secondary | ICD-10-CM

## 2017-07-23 NOTE — Progress Notes (Signed)
Daily Session Note  Patient Details  Name: Adam Cline MRN: [id number] Date of Birth: [birth year] Referring Provider:     Cardiac Rehab from 07/20/2017 in [facility] Cardiac and Pulmonary Rehab  Referring Provider  Serafina Royals MD      Encounter Date: 07/23/2017  Check In:     Session Check In - 07/23/17 1639      Check-In   Location [facility]-Cardiac & Pulmonary Rehab   Staff Present Nada Maclachlan, BA, ACSM CEP, Exercise Physiologist;Kelly Amedeo Plenty, BS, ACSM CEP, Exercise Physiologist;Ryley Bachtel Tessie Fass RCP,RRT,BSRT;Carroll Enterkin, RN, BSN   Supervising physician immediately available to respond to emergencies See telemetry face sheet for immediately available ER MD   Medication changes reported     No   Fall or balance concerns reported    No   Warm-up and Cool-down Performed on first and last piece of equipment   Resistance Training Performed Yes   VAD Patient? No     Pain Assessment   Currently in Pain? No/denies   Multiple Pain Sites No         History  Smoking Status  . Never Smoker  Smokeless Tobacco  . Not on file    Goals Met:  Proper associated with RPD/PD & O2 Sat Independence with exercise equipment Exercise tolerated well No report of cardiac concerns or symptoms Strength training completed today  Goals Unmet:  Not Applicable  Comments: Pt able to follow exercise prescription today without complaint.  Will continue to monitor for progression.   Dr. Emily Filbert is Medical Director for [place] and [name].

## 2017-07-27 DIAGNOSIS — I214 Non-ST elevation (NSTEMI) myocardial infarction: Secondary | ICD-10-CM

## 2017-07-27 DIAGNOSIS — I252 Old myocardial infarction: Secondary | ICD-10-CM | POA: Diagnosis not present

## 2017-07-27 NOTE — Progress Notes (Signed)
Daily Session Note  Patient Details  Name: Adam Cline MRN: [id number] Date of Birth: 1961-05-27 Referring Provider:     Cardiac Rehab from 07/20/2017 in [facility] Cardiac and Pulmonary Rehab  Referring Provider  Serafina Royals MD      Encounter Date: 07/27/2017  Check In:     Session Check In - 07/27/17 1725      Check-In   Location [facility]-Cardiac & Pulmonary Rehab   Staff Present Nada Maclachlan, BA, ACSM CEP, Exercise Physiologist;Kelly Amedeo Plenty, BS, ACSM CEP, Exercise Physiologist;Meredith Sherryll Burger, RN BSN   Supervising physician immediately available to respond to emergencies See telemetry face sheet for immediately available ER MD   Medication changes reported     No   Fall or balance concerns reported    No   Warm-up and Cool-down Performed on first and last piece of equipment   Resistance Training Performed Yes   VAD Patient? No     Pain Assessment   Currently in Pain? No/denies         History  Smoking Status  . Never Smoker  Smokeless Tobacco  . Not on file    Goals Met:  Independence with exercise equipment Exercise tolerated well No report of cardiac concerns or symptoms Strength training completed today  Goals Unmet:  Not Applicable  Comments: Pt able to follow exercise prescription today without complaint.  Will continue to monitor for progression.    Dr. Emily Filbert is Medical Director for [place] and [name].

## 2017-07-29 ENCOUNTER — Encounter: Payer: 59 | Attending: Internal Medicine | Admitting: *Deleted

## 2017-07-29 DIAGNOSIS — I252 Old myocardial infarction: Secondary | ICD-10-CM | POA: Insufficient documentation

## 2017-07-29 DIAGNOSIS — I214 Non-ST elevation (NSTEMI) myocardial infarction: Secondary | ICD-10-CM

## 2017-07-29 NOTE — Progress Notes (Signed)
Daily Session Note  Patient Details  Name: Adam Cline MRN: [id number] Date of Birth: June 05, 1961 Referring Provider:     Cardiac Rehab from 07/20/2017 in [facility] Cardiac and Pulmonary Rehab  Referring Provider  Serafina Royals MD      Encounter Date: 07/29/2017  Check In:     Session Check In - 07/29/17 1721      Check-In   Location [facility]-Cardiac & Pulmonary Rehab   Staff Present Gerlene Burdock, RN, Vickki Hearing, BA, ACSM CEP, Exercise Physiologist;Meredith Sherryll Burger, RN BSN   Supervising physician immediately available to respond to emergencies See telemetry face sheet for immediately available ER MD   Medication changes reported     No   Fall or balance concerns reported    No   Tobacco Cessation No Change   Warm-up and Cool-down Performed on first and last piece of equipment   Resistance Training Performed Yes   VAD Patient? No     Pain Assessment   Currently in Pain? No/denies         History  Smoking Status  . Never Smoker  Smokeless Tobacco  . Not on file    Goals Met:  Proper associated with RPD/PD & O2 Sat No report of cardiac concerns or symptoms Strength training completed today  Goals Unmet:  Not Applicable  Comments:     Dr. Emily Filbert is Medical Director for [place] and [name].

## 2017-07-30 DIAGNOSIS — I214 Non-ST elevation (NSTEMI) myocardial infarction: Secondary | ICD-10-CM

## 2017-07-30 DIAGNOSIS — I252 Old myocardial infarction: Secondary | ICD-10-CM | POA: Diagnosis not present

## 2017-07-30 NOTE — Progress Notes (Signed)
Daily Session Note  Patient Details  Name: Wrangler Penning MRN: [id number] Date of Birth: June 10, 1961 Referring Provider:     Cardiac Rehab from 07/20/2017 in [facility] Cardiac and Pulmonary Rehab  Referring Provider  Serafina Royals MD      Encounter Date: 07/30/2017  Check In:     Session Check In - 07/30/17 1642      Check-In   Location [facility]-Cardiac & Pulmonary Rehab   Staff Present Nada Maclachlan, BA, ACSM CEP, Exercise Physiologist;Carroll Enterkin, RN, Moises Blood, BS, ACSM CEP, Exercise Physiologist;Joseph Flavia Shipper   Supervising physician immediately available to respond to emergencies See telemetry face sheet for immediately available ER MD   Medication changes reported     No   Fall or balance concerns reported    No   Warm-up and Cool-down Performed on first and last piece of equipment   Resistance Training Performed Yes   VAD Patient? No     Pain Assessment   Currently in Pain? No/denies   Multiple Pain Sites No         History  Smoking Status  . Never Smoker  Smokeless Tobacco  . Not on file    Goals Met:  Independence with exercise equipment Exercise tolerated well No report of cardiac concerns or symptoms Strength training completed today  Goals Unmet:  Not Applicable  Comments: Pt able to follow exercise prescription today without complaint.  Will continue to monitor for progression. Home Exercise reviewed with patient.   Dr. Emily Filbert is Medical Director for [place] and [name].

## 2017-08-03 ENCOUNTER — Encounter: Payer: 59 | Admitting: *Deleted

## 2017-08-03 DIAGNOSIS — I214 Non-ST elevation (NSTEMI) myocardial infarction: Secondary | ICD-10-CM

## 2017-08-03 DIAGNOSIS — I252 Old myocardial infarction: Secondary | ICD-10-CM | POA: Diagnosis not present

## 2017-08-03 NOTE — Progress Notes (Signed)
Daily Session Note  Patient Details  Name: Adam Cline MRN: [id number] Date of Birth: Feb 15, 1961 Referring Provider:     Cardiac Rehab from 07/20/2017 in [facility] Cardiac and Pulmonary Rehab  Referring Provider  Serafina Royals MD      Encounter Date: 08/03/2017  Check In:     Session Check In - 08/03/17 1732      Check-In   Location [facility]-Cardiac & Pulmonary Rehab   Staff Present Nyoka Cowden, RN, BSN, Bonnita [place], BS, ACSM CEP, Exercise Physiologist;Other  Carson Myrtle, RRT   Supervising physician immediately available to respond to emergencies See telemetry face sheet for immediately available ER MD   Medication changes reported     No   Fall or balance concerns reported    No   Tobacco Cessation No Change   Warm-up and Cool-down Performed on first and last piece of equipment   Resistance Training Performed Yes   VAD Patient? No     Pain Assessment   Currently in Pain? No/denies         History  Smoking Status  . Never Smoker  Smokeless Tobacco  . Not on file    Goals Met:  Proper associated with RPD/PD & O2 Sat Exercise tolerated well No report of cardiac concerns or symptoms Strength training completed today  Goals Unmet:  Not Applicable  Comments:     Dr. Emily Filbert is Medical Director for [name] and [name].

## 2017-08-05 ENCOUNTER — Encounter: Payer: 59 | Admitting: *Deleted

## 2017-08-05 DIAGNOSIS — I214 Non-ST elevation (NSTEMI) myocardial infarction: Secondary | ICD-10-CM

## 2017-08-05 DIAGNOSIS — I252 Old myocardial infarction: Secondary | ICD-10-CM | POA: Diagnosis not present

## 2017-08-05 NOTE — Progress Notes (Signed)
Daily Session Note  Patient Details  Name: Adam Cline MRN: [id number] Date of Birth: Dec 22, 1961 Referring Provider:     Cardiac Rehab from 07/20/2017 in [facility] Cardiac and Pulmonary Rehab  Referring Provider  Serafina Royals MD      Encounter Date: 08/05/2017  Check In:     Session Check In - 08/05/17 1742      Check-In   Location [facility]-Cardiac & Pulmonary Rehab   Staff Present Nyoka Cowden, RN, BSN, MA;Susanne Bice, RN, BSN, CCRP;Ekaterina Denise Sherryll Burger, RN BSN   Supervising physician immediately available to respond to emergencies See telemetry face sheet for immediately available ER MD   Medication changes reported     No   Fall or balance concerns reported    No   Warm-up and Cool-down Performed on first and last piece of equipment   Resistance Training Performed Yes   VAD Patient? No     Pain Assessment   Currently in Pain? No/denies         History  Smoking Status  . Never Smoker  Smokeless Tobacco  . Not on file    Goals Met:  Proper associated with RPD/PD & O2 Sat Independence with exercise equipment Exercise tolerated well No report of cardiac concerns or symptoms Strength training completed today  Goals Unmet:  Not Applicable  Comments: Pt able to follow exercise prescription today without complaint.  Will continue to monitor for progression.    Dr. Emily Filbert is Medical Director for [place] and [name].

## 2017-08-06 ENCOUNTER — Encounter: Payer: 59 | Admitting: *Deleted

## 2017-08-06 ENCOUNTER — Encounter: Payer: Self-pay | Admitting: Dietician

## 2017-08-06 DIAGNOSIS — I252 Old myocardial infarction: Secondary | ICD-10-CM | POA: Diagnosis not present

## 2017-08-06 DIAGNOSIS — I214 Non-ST elevation (NSTEMI) myocardial infarction: Secondary | ICD-10-CM

## 2017-08-06 NOTE — Progress Notes (Signed)
Daily Session Note  Patient Details  Name: Adam Cline MRN: [id number] Date of Birth: 01-28-1961 Referring Provider:     Cardiac Rehab from 07/20/2017 in [facility] Cardiac and Pulmonary Rehab  Referring Provider  Serafina Royals MD      Encounter Date: 08/06/2017  Check In:     Session Check In - 08/06/17 1736      Check-In   Staff Present Heath Lark, RN, BSN, CCRP;Carroll Enterkin, RN, BSN;Joseph [employer] physician immediately available to respond to emergencies See telemetry face sheet for immediately available ER MD   Medication changes reported     No   Fall or balance concerns reported    No   Warm-up and Cool-down Performed on first and last piece of equipment   Resistance Training Performed Yes   VAD Patient? No     Pain Assessment   Currently in Pain? No/denies           Exercise Prescription Changes - 08/06/17 1100      Response to Exercise   Blood Pressure (Admit) 128/68   Blood Pressure (Exercise) 146/72   Blood Pressure (Exit) 104/64   Heart Rate (Admit) 96 bpm   Heart Rate (Exercise) 105 bpm   Heart Rate (Exit) 75 bpm   Rating of Perceived Exertion (Exercise) 12   Symptoms none   Duration Continue with 45 min of aerobic exercise without signs/symptoms of physical distress.   Intensity THRR unchanged     Progression   Progression Continue to progress workloads to maintain intensity without signs/symptoms of physical distress.   Average METs 3.34     Resistance Training   Training Prescription Yes   Weight 4 lbs   Reps 10-15     Interval Training   Interval Training No     Treadmill   MPH 3   Grade 1   Minutes 15   METs 3.71     REL-XR   Level 2   Minutes 15   METs 3.9     T5 Nustep   Level 3   Minutes 15   METs 2.4     Home Exercise Plan   Plans to continue exercise at Home (comment)  plans to walk at home 2 days a week   Frequency Add 2 additional days to program exercise sessions.   Initial Home Exercises  Provided 07/30/17      History  Smoking Status  . Never Smoker  Smokeless Tobacco  . Not on file    Goals Met:  Exercise tolerated well No report of cardiac concerns or symptoms Strength training completed today  Goals Unmet:  Not Applicable  Comments: Doing well with exercise prescription progression.    Dr. Emily Filbert is Medical Director for [place] and [name].

## 2017-08-10 DIAGNOSIS — I252 Old myocardial infarction: Secondary | ICD-10-CM | POA: Diagnosis not present

## 2017-08-10 DIAGNOSIS — I214 Non-ST elevation (NSTEMI) myocardial infarction: Secondary | ICD-10-CM

## 2017-08-10 NOTE — Progress Notes (Signed)
Daily Session Note  Patient Details  Name: Adam Cline MRN: [id number] Date of Birth: April 18, 1961 Referring Provider:     Cardiac Rehab from 07/20/2017 in [facility] Cardiac and Pulmonary Rehab  Referring Provider  Serafina Royals MD      Encounter Date: 08/10/2017  Check In:     Session Check In - 08/10/17 1746      Check-In   Location [facility]-Cardiac & Pulmonary Rehab   Staff Present Nada Maclachlan, BA, ACSM CEP, Exercise Physiologist;Kelly Amedeo Plenty, BS, ACSM CEP, Exercise Physiologist;Meredith Sherryll Burger, RN BSN   Supervising physician immediately available to respond to emergencies See telemetry face sheet for immediately available ER MD   Medication changes reported     No   Fall or balance concerns reported    No   Warm-up and Cool-down Performed on first and last piece of equipment   Resistance Training Performed Yes   VAD Patient? No     Pain Assessment   Currently in Pain? No/denies         History  Smoking Status  . Never Smoker  Smokeless Tobacco  . Not on file    Goals Met:  Independence with exercise equipment Exercise tolerated well No report of cardiac concerns or symptoms Strength training completed today  Goals Unmet:  Not Applicable  Comments: Pt able to follow exercise prescription today without complaint.  Will continue to monitor for progression.    Dr. Emily Filbert is Medical Director for [place] and [name].

## 2017-08-12 ENCOUNTER — Encounter: Payer: Self-pay | Admitting: *Deleted

## 2017-08-12 DIAGNOSIS — I214 Non-ST elevation (NSTEMI) myocardial infarction: Secondary | ICD-10-CM

## 2017-08-12 DIAGNOSIS — I252 Old myocardial infarction: Secondary | ICD-10-CM | POA: Diagnosis not present

## 2017-08-12 NOTE — Progress Notes (Signed)
Cardiac Individual Treatment Plan  Patient Details  Name: Adam Cline MRN: [id number] Date of Birth: [birth year] Referring Provider:     Cardiac Rehab from 07/20/2017 in [facility] Cardiac and Pulmonary Rehab  Referring Provider  Serafina Royals MD      Initial Encounter Date:    Cardiac Rehab from 07/20/2017 in [facility] Cardiac and Pulmonary Rehab  Date  07/20/17  Referring Provider  Serafina Royals MD      Visit Diagnosis: NSTEMI (non-ST elevated myocardial infarction) [facility])  Patient's Home Medications on Admission:  Current Outpatient Prescriptions:  .  aspirin EC 81 MG tablet, 1 tablet once daily., Disp: , Rfl:  .  atorvastatin (LIPITOR) 80 MG tablet, Take by mouth., Disp: , Rfl:  .  fluticasone (FLONASE) 50 MCG/ACT nasal spray, Place into the nose., Disp: , Rfl:  .  lisinopril (PRINIVIL,ZESTRIL) 10 MG tablet, Take by mouth., Disp: , Rfl:  .  metoprolol tartrate (LOPRESSOR) 25 MG tablet, Take by mouth., Disp: , Rfl:  .  nitroGLYCERIN (NITROSTAT) 0.4 MG SL tablet, as needed., Disp: , Rfl:  .  ticagrelor (BRILINTA) 90 MG TABS tablet, Take by mouth., Disp: , Rfl:   Past Medical History: No past medical history on file.  Tobacco Use: History  Smoking Status  . Never Smoker  Smokeless Tobacco  . Not on file    Labs: Recent Review Flowsheet Data    There is no flowsheet data to display.       Exercise Target Goals:    Exercise Program Goal: Individual exercise prescription set with THRR, safety & activity barriers. Participant demonstrates ability to understand and report RPE using BORG scale, to self-measure pulse accurately, and to acknowledge the importance of the exercise prescription.  Exercise Prescription Goal: Starting with aerobic activity 30 plus minutes a day, 3 days per week for initial exercise prescription. Provide home exercise prescription and guidelines that participant acknowledges understanding prior to discharge.  Activity Barriers & Risk  Stratification:     Activity Barriers & Cardiac Risk Stratification - 07/20/17 1240      Activity Barriers & Cardiac Risk Stratification   Activity Barriers Joint Problems;Deconditioning;Muscular Weakness  knee achy, R shoulder limited ROM   Cardiac Risk Stratification High      6 Minute Walk:     6 Minute Walk    Row Name 07/20/17 1411         6 Minute Walk   Phase Initial     Distance 1268 feet     Walk Time 6 minutes     # of Rest Breaks 0     MPH 2.4     METS 3.8     RPE 9     VO2 Peak 13.3     Symptoms No     Resting HR 60 bpm     Resting BP 134/60     Max Ex. HR 99 bpm     Max Ex. BP 162/74     2 Minute Post BP 126/70        Oxygen Initial Assessment:   Oxygen Re-Evaluation:   Oxygen Discharge (Final Oxygen Re-Evaluation):   Initial Exercise Prescription:     Initial Exercise Prescription - 07/20/17 1400      Date of Initial Exercise RX and Referring Provider   Date 07/20/17   Referring Provider Serafina Royals MD     Treadmill   MPH 3   Grade 1   Minutes 15   METs 3.71     REL-XR  Level 2   Speed 50   Minutes 15   METs 2     T5 Nustep   Level 3   SPM 80   Minutes 15   METs 2     Prescription Details   Frequency (times per week) 3   Duration Progress to 45 minutes of aerobic exercise without signs/symptoms of physical distress     Intensity   THRR 40-80% of Max Heartrate 102-143   Ratings of Perceived Exertion 11-13   Perceived Dyspnea 0-4     Progression   Progression Continue to progress workloads to maintain intensity without signs/symptoms of physical distress.     Resistance Training   Training Prescription Yes   Weight 4 lbs   Reps 10-15      Perform Capillary Blood Glucose checks as needed.  Exercise Prescription Changes:     Exercise Prescription Changes    Row Name 07/20/17 1400 07/24/17 1100 07/30/17 1600 08/06/17 1100       Response to Exercise   Blood Pressure (Admit) 134/60 124/70  - 128/68     Blood Pressure (Exercise) 162/74 134/82  - 146/72    Blood Pressure (Exit) 126/70 122/80  - 104/64    Heart Rate (Admit) 60 bpm 94 bpm  - 96 bpm    Heart Rate (Exercise) 99 bpm 117 bpm  - 105 bpm    Heart Rate (Exit) 62 bpm 72 bpm  - 75 bpm    Oxygen Saturation (Admit) 100 %  -  -  -    Oxygen Saturation (Exercise) 100 %  -  -  -    Rating of Perceived Exertion (Exercise) 9 13  - 12    Symptoms none none  - none    Comments walk test results  -  -  -    Duration  - Progress to 45 minutes of aerobic exercise without signs/symptoms of physical distress Progress to 45 minutes of aerobic exercise without signs/symptoms of physical distress Continue with 45 min of aerobic exercise without signs/symptoms of physical distress.    Intensity  - THRR unchanged THRR unchanged THRR unchanged      Progression   Progression  - Continue to progress workloads to maintain intensity without signs/symptoms of physical distress. Continue to progress workloads to maintain intensity without signs/symptoms of physical distress. Continue to progress workloads to maintain intensity without signs/symptoms of physical distress.    Average METs  - 3.75 3.75 3.34      Resistance Training   Training Prescription  - Yes Yes Yes    Weight  - 4 lbs 4 lbs 4 lbs    Reps  - 10-15 10-15 10-15      Interval Training   Interval Training  - No No No      Treadmill   MPH  - 3 3 3     Grade  - 1 1 1     Minutes  - 15 15 15     METs  - 3.71 3.71 3.71      REL-XR   Level  - 2 2 2     Speed  - 50 50  -    Minutes  - 15 15 15     METs  - 3.8 3.8 3.9      T5 Nustep   Level  -  -  - 3    Minutes  -  -  - 15    METs  -  -  - 2.4  Home Exercise Plan   Plans to continue exercise at  -  - Home (comment)  plans to walk at home 2 days a week Home (comment)  plans to walk at home 2 days a week    Frequency  -  - Add 2 additional days to program exercise sessions. Add 2 additional days to program exercise sessions.    Initial  Home Exercises Provided  -  - 07/30/17 07/30/17       Exercise Comments:     Exercise Comments    Row Name 07/30/17 1643           Exercise Comments Home Exercise reviewed with patient.          Exercise Goals and Review:     Exercise Goals    Row Name 07/20/17 1414             Exercise Goals   Increase Physical Activity Yes       Intervention Provide advice, education, support and counseling about physical activity/exercise needs.;Develop an individualized exercise prescription for aerobic and resistive training based on initial evaluation findings, risk stratification, comorbidities and participant's personal goals.       Expected Outcomes Achievement of increased cardiorespiratory fitness and enhanced flexibility, muscular endurance and strength shown through measurements of functional capacity and personal statement of participant.       Increase Strength and Stamina Yes       Intervention Provide advice, education, support and counseling about physical activity/exercise needs.;Develop an individualized exercise prescription for aerobic and resistive training based on initial evaluation findings, risk stratification, comorbidities and participant's personal goals.       Expected Outcomes Achievement of increased cardiorespiratory fitness and enhanced flexibility, muscular endurance and strength shown through measurements of functional capacity and personal statement of participant.          Exercise Goals Re-Evaluation :     Exercise Goals Re-Evaluation    Row Name 07/30/17 1655 08/06/17 1114           Exercise Goal Re-Evaluation   Exercise Goals Review Increase Physical Activity;Increase Strenth and Stamina Increase Physical Activity;Increase Strenth and Stamina      Comments Home exercise reviewed with patient. Quince plans to walk 2 days a week when not in Rehab. Enis is off to a good start in rehab.  He has started to add walking in on his off days.  We will  continue to monitor his progression.      Expected Outcomes Short: Peregrine will walk two days a week at home. Long: Remy will increase his walking to more than 2 days week Short: Continue to move up workloads.  Long: Make exercise part of daily habit.         Discharge Exercise Prescription (Final Exercise Prescription Changes):     Exercise Prescription Changes - 08/06/17 1100      Response to Exercise   Blood Pressure (Admit) 128/68   Blood Pressure (Exercise) 146/72   Blood Pressure (Exit) 104/64   Heart Rate (Admit) 96 bpm   Heart Rate (Exercise) 105 bpm   Heart Rate (Exit) 75 bpm   Rating of Perceived Exertion (Exercise) 12   Symptoms none   Duration Continue with 45 min of aerobic exercise without signs/symptoms of physical distress.   Intensity THRR unchanged     Progression   Progression Continue to progress workloads to maintain intensity without signs/symptoms of physical distress.   Average METs 3.34     Resistance Training  Training Prescription Yes   Weight 4 lbs   Reps 10-15     Interval Training   Interval Training No     Treadmill   MPH 3   Grade 1   Minutes 15   METs 3.71     REL-XR   Level 2   Minutes 15   METs 3.9     T5 Nustep   Level 3   Minutes 15   METs 2.4     Home Exercise Plan   Plans to continue exercise at Home (comment)  plans to walk at home 2 days a week   Frequency Add 2 additional days to program exercise sessions.   Initial Home Exercises Provided 07/30/17      Nutrition:  Target Goals: Understanding of nutrition guidelines, daily intake of sodium <1525m, cholesterol <2054m calories 30% from fat and 7% or less from saturated fats, daily to have 5 or more servings of fruits and vegetables.  Biometrics:      Post Biometrics - 07/20/17 1414       Post  Biometrics   Height 5' 9.7" (1.77 m)   Weight 197 lb 9.6 oz (89.6 kg)   Waist Circumference 38.5 inches   Hip Circumference 40 inches   Waist to Hip Ratio 0.96 %    BMI (Calculated) 28.7   Single Leg Stand 30 seconds      Nutrition Therapy Plan and Nutrition Goals:     Nutrition Therapy & Goals - 08/06/17 1800      Nutrition Therapy   Diet TLC   Protein (specify units) 11oz   Whole Grain Foods 3 servings   Saturated Fats 14 max. grams   Fruits and Vegetables 8 servings/day   Sodium 1500 grams     Personal Nutrition Goals   Nutrition Goal Continue with weight loss efforts by following 1800 calorie meal plan   Personal Goal #2 Keep working on portion control of foods, expecially starches and cheese. Eat plenty of vegetables and fruits.    Personal Goal #3 check food labels for sodium and keep trying more low sodium choices.    Comments Mr. ZaNoteboomas lost about 10-15lbs since working on diet and lifestyle changes. He reports he has significantly reduced his intake of pasta and breads, as well as beer. Weight loss has hit a plateau for now, but he will continue to work on food portions and low fat, low sodium choices.      Intervention Plan   Intervention Nutrition handout(s) given to patient.   Expected Outcomes Short Term Goal: A plan has been developed with personal nutrition goals set during dietitian appointment.;Long Term Goal: Adherence to prescribed nutrition plan.      Nutrition Discharge: Rate Your Plate Scores:     Nutrition Assessments - 07/20/17 1246      MEDFICTS Scores   Pre Score 43      Nutrition Goals Re-Evaluation:   Nutrition Goals Discharge (Final Nutrition Goals Re-Evaluation):   Psychosocial: Target Goals: Acknowledge presence or absence of significant depression and/or stress, maximize coping skills, provide positive support system. Participant is able to verbalize types and ability to use techniques and skills needed for reducing stress and depression.   Initial Review & Psychosocial Screening:     Initial Psych Review & Screening - 07/20/17 1242      Initial Review   Current issues with Current Stress  Concerns   Source of Stress Concerns Occupation   Comments "Plant Manager/Machinist"  Quality of Life Scores:      Quality of Life - 07/20/17 1242      Quality of Life Scores   Health/Function Pre 22.48 %   Socioeconomic Pre 25.92 %   Psych/Spiritual Pre 24.64 %   Family Pre 27.88 %   GLOBAL Pre 24.37 %      PHQ-9: Recent Review Flowsheet Data    Depression screen [facility] 2/9 07/20/2017   Decreased Interest 0   Down, Depressed, Hopeless 0   PHQ - 2 Score 0   Altered sleeping 0   Tired, decreased energy 1   Change in appetite 0   Feeling bad or failure about yourself  0   Trouble concentrating 0   Moving slowly or fidgety/restless 0   Suicidal thoughts 0   PHQ-9 Score 1   Difficult doing work/chores Not difficult at all     Interpretation of Total Score  Total Score Depression Severity:  1-4 = Minimal depression, 5-9 = Mild depression, 10-14 = Moderate depression, 15-19 = Moderately severe depression, 20-27 = Severe depression   Psychosocial Evaluation and Intervention:     Psychosocial Evaluation - 07/27/17 1704      Psychosocial Evaluation & Interventions   Interventions Encouraged to exercise with the program and follow exercise prescription;Stress management education;Relaxation education   Comments Counselor met with Mr. Crisanto Gunning) today for initial psychosocial evaluation.  He is a 56 year old who had a mild heart attack while visiting family in [place] on Christmas day of 2017.  He had two stents inserted the next day.  Larz has a strong support system with a spouse of 21 years; active involvement in his local church community; and a supportive work environment.  Tomi has no other health issues and states he sleeps well and has a "wonderful" appetite.  Tyan denies a history of depression or anxiety or any current symptoms and states he is typically in a positive mood most of the time.  Tyric listed his stressors as being his job and finances.  He copes by talking to  his spouse and tries to just "let it go."  Vann has goals to lose weight and increase his energy while in this program.  He will be followed by staff throughout the course of this class.    Expected Outcomes Enis will benefit from consistent exercise to achieve his stated goals for this program.  A meeting with the dietician will be helpful in addressing his weight loss goals.  He will also benefit from the psychoeducational components of this program to help develop positive coping skills and learn to manage stress with exercise and relaxation.   Continue Psychosocial Services  Follow up required by staff      Psychosocial Re-Evaluation:   Psychosocial Discharge (Final Psychosocial Re-Evaluation):   Vocational Rehabilitation: Provide vocational rehab assistance to qualifying candidates.   Vocational Rehab Evaluation & Intervention:     Vocational Rehab - 07/20/17 1237      Initial Vocational Rehab Evaluation & Intervention   Assessment shows need for Vocational Rehabilitation No      Education: Education Goals: Education classes will be provided on a weekly basis, covering required topics. Participant will state understanding/return demonstration of topics presented.  Learning Barriers/Preferences:     Learning Barriers/Preferences - 07/20/17 1237      Learning Barriers/Preferences   Learning Barriers None   Learning Preferences None      Education Topics: General Nutrition Guidelines/Fats and Fiber: -Group instruction provided by verbal, written  material, models and posters to present the general guidelines for heart healthy nutrition. Gives an explanation and review of dietary fats and fiber.   Controlling Sodium/Reading Food Labels: -Group verbal and written material supporting the discussion of sodium use in heart healthy nutrition. Review and explanation with models, verbal and written materials for utilization of the food label.   Exercise Physiology & Risk  Factors: - Group verbal and written instruction with models to review the exercise physiology of the cardiovascular system and associated critical values. Details cardiovascular disease risk factors and the goals associated with each risk factor.   Aerobic Exercise & Resistance Training: - Gives group verbal and written discussion on the health impact of inactivity. On the components of aerobic and resistive training programs and the benefits of this training and how to safely progress through these programs.   Flexibility, Balance, General Exercise Guidelines: - Provides group verbal and written instruction on the benefits of flexibility and balance training programs. Provides general exercise guidelines with specific guidelines to those with heart or lung disease. Demonstration and skill practice provided.   Stress Management: - Provides group verbal and written instruction about the health risks of elevated stress, cause of high stress, and healthy ways to reduce stress.   Cardiac Rehab from 08/10/2017 in [facility] Cardiac and Pulmonary Rehab  Date  07/22/17  Educator  [facility]  Instruction Review Code  2- meets goals/outcomes      Depression: - Provides group verbal and written instruction on the correlation between heart/lung disease and depressed mood, treatment options, and the stigmas associated with seeking treatment.   Anatomy & Physiology of the Heart: - Group verbal and written instruction and models provide basic cardiac anatomy and physiology, with the coronary electrical and arterial systems. Review of: AMI, Angina, Valve disease, Heart Failure, Cardiac Arrhythmia, Pacemakers, and the ICD.   Cardiac Procedures: - Group verbal and written instruction and models to describe the testing methods done to diagnose heart disease. Reviews the outcomes of the test results. Describes the treatment choices: Medical Management, Angioplasty, or Coronary Bypass Surgery.   Cardiac Rehab from  08/10/2017 in [facility] Cardiac and Pulmonary Rehab  Date  07/27/17  Educator  [facility]  Instruction Review Code  2- meets goals/outcomes      Cardiac Medications: - Group verbal and written instruction to review commonly prescribed medications for heart disease. Reviews the medication, class of the drug, and side effects. Includes the steps to properly store meds and maintain the prescription regimen.   Cardiac Rehab from 08/10/2017 in [facility] Cardiac and Pulmonary Rehab  Date  08/03/17 [Part 2 08/05/17]  Educator  MJA, RN [Part 2 SB]  Instruction Review Code  2- meets goals/outcomes      Go Sex-Intimacy & Heart Disease, Get SMART - Goal Setting: - Group verbal and written instruction through game format to discuss heart disease and the return to sexual intimacy. Provides group verbal and written material to discuss and apply goal setting through the application of the S.M.A.R.T. Method.   Cardiac Rehab from 08/10/2017 in [facility] Cardiac and Pulmonary Rehab  Date  07/27/17  Educator  [facility]  Instruction Review Code  2- meets goals/outcomes      Other Matters of the Heart: - Provides group verbal, written materials and models to describe Heart Failure, Angina, Valve Disease, and Diabetes in the realm of heart disease. Includes description of the disease process and treatment options available to the cardiac patient.   Exercise & Equipment Safety: - Individual verbal  instruction and demonstration of equipment use and safety with use of the equipment.   Cardiac Rehab from 08/10/2017 in [facility] Cardiac and Pulmonary Rehab  Date  07/20/17  Educator  C. EnterkinRN  Instruction Review Code  1- partially meets, needs review/practice      Infection Prevention: - Provides verbal and written material to individual with discussion of infection control including proper hand washing and proper equipment cleaning during exercise session.   Cardiac Rehab from 08/10/2017 in [facility] Cardiac and Pulmonary Rehab  Date  07/20/17   Educator  C. [place]  Instruction Review Code  1- partially meets, needs review/practice      Falls Prevention: - Provides verbal and written material to individual with discussion of falls prevention and safety.   Cardiac Rehab from 08/10/2017 in [facility] Cardiac and Pulmonary Rehab  Date  07/20/17  Educator  C. [place]  Instruction Review Code  1- partially meets, needs review/practice      Diabetes: - Individual verbal and written instruction to review signs/symptoms of diabetes, desired ranges of glucose level fasting, after meals and with exercise. Advice that pre and post exercise glucose checks will be done for 3 sessions at entry of program.    Knowledge Questionnaire Score:     Knowledge Questionnaire Score - 07/20/17 1237      Knowledge Questionnaire Score   Pre Score 24/28      Core Components/Risk Factors/Patient Goals at Admission:     Personal Goals and Risk Factors at Admission - 07/20/17 1241      Core Components/Risk Factors/Patient Goals on Admission    Weight Management Weight Loss;Yes   Intervention Weight Management: Develop a combined nutrition and exercise program designed to reach desired caloric intake, while maintaining appropriate intake of nutrient and fiber, sodium and fats, and appropriate energy expenditure required for the weight goal.;Weight Management: Provide education and appropriate resources to help participant work on and attain dietary goals.   Admit Weight 197 lb 9.6 oz (89.6 kg)   Goal Weight: Short Term 192 lb (87.1 kg)   Goal Weight: Long Term 185 lb (83.9 kg)   Expected Outcomes Short Term: Continue to assess and modify interventions until short term weight is achieved;Weight Loss: Understanding of general recommendations for a balanced deficit meal plan, which promotes 1-2 lb weight loss per week and includes a negative energy balance of [phone] kcal/d;Long Term: Adherence to nutrition and physical activity/exercise program  aimed toward attainment of established weight goal;Understanding recommendations for meals to include 15-35% energy as protein, 25-35% energy from fat, 35-60% energy from carbohydrates, less than 277m of dietary cholesterol, 20-35 gm of total fiber daily;Understanding of distribution of calorie intake throughout the day with the consumption of 4-5 meals/snacks   Hypertension Yes   Intervention Provide education on lifestyle modifcations including regular physical activity/exercise, weight management, moderate sodium restriction and increased consumption of fresh fruit, vegetables, and low fat dairy, alcohol moderation, and smoking cessation.;Monitor prescription use compliance.   Expected Outcomes Short Term: Continued assessment and intervention until BP is < 140/929mHG in hypertensive participants. < 130/8075mG in hypertensive participants with diabetes, heart failure or chronic kidney disease.;Long Term: Maintenance of blood pressure at goal levels.   Lipids Yes   Intervention Provide education and support for participant on nutrition & aerobic/resistive exercise along with prescribed medications to achieve LDL <54m17mDL >40mg63mExpected Outcomes Long Term: Cholesterol controlled with medications as prescribed, with individualized exercise RX and with personalized nutrition plan. Value goals: LDL <  59m, HDL > 40 mg.;Short Term: Participant states understanding of desired cholesterol values and is compliant with medications prescribed. Participant is following exercise prescription and nutrition guidelines.   Stress Yes   Intervention Offer individual and/or small group education and counseling on adjustment to heart disease, stress management and health-related lifestyle change. Teach and support self-help strategies.;Refer participants experiencing significant psychosocial distress to appropriate mental health specialists for further evaluation and treatment. When possible, include family members  and significant others in education/counseling sessions.   Expected Outcomes Short Term: Participant demonstrates changes in health-related behavior, relaxation and other stress management skills, ability to obtain effective social support, and compliance with psychotropic medications if prescribed.;Long Term: Emotional wellbeing is indicated by absence of clinically significant psychosocial distress or social isolation.      Core Components/Risk Factors/Patient Goals Review:    Core Components/Risk Factors/Patient Goals at Discharge (Final Review):    ITP Comments:     ITP Comments    Row Name 07/20/17 1238 08/12/17 0649         ITP Comments ITP Created during Medical review after Cardiac Rehab informed consent was signed today at 10:22am. NSTEMI diagnosis [facility] [place].  30 day review. Continue with ITP unless directed changes per Medical Director review   New to program         Comments:

## 2017-08-12 NOTE — Progress Notes (Signed)
Daily Session Note  Patient Details  Name: Delfin Squillace MRN: [id number] Date of Birth: Jun 13, 1961 Referring Provider:     Cardiac Rehab from 07/20/2017 in [facility] Cardiac and Pulmonary Rehab  Referring Provider  Serafina Royals MD      Encounter Date: 08/12/2017  Check In:     Session Check In - 08/12/17 1626      Check-In   Location [facility]-Cardiac & Pulmonary Rehab   Staff Present Gerlene Burdock, RN, Levie Heritage, MA, ACSM RCEP, Exercise Physiologist;Amanda Oletta Darter, BA, ACSM CEP, Exercise Physiologist;Meredith Sherryll Burger, RN BSN;Joseph Flavia Shipper   Supervising physician immediately available to respond to emergencies See telemetry face sheet for immediately available ER MD   Medication changes reported     No   Fall or balance concerns reported    No   Warm-up and Cool-down Performed on first and last piece of equipment   Resistance Training Performed Yes   VAD Patient? No     Pain Assessment   Currently in Pain? No/denies   Multiple Pain Sites No         History  Smoking Status  . Never Smoker  Smokeless Tobacco  . Not on file    Goals Met:  Proper associated with RPD/PD & O2 Sat Exercise tolerated well No report of cardiac concerns or symptoms Strength training completed today  Goals Unmet:  Not Applicable  Comments:     Dr. Emily Filbert is Medical Director for [place] and [name].

## 2017-08-12 NOTE — Progress Notes (Signed)
Daily Session Note  Patient Details  Name: Adam Cline MRN: [id number] Date of Birth: [birth year] Referring Provider:     Cardiac Rehab from 07/20/2017 in [facility] Cardiac and Pulmonary Rehab  Referring Provider  Serafina Royals MD      Encounter Date: 08/12/2017  Check In:     Session Check In - 08/12/17 1626      Check-In   Location [facility]-Cardiac & Pulmonary Rehab   Staff Present Gerlene Burdock, RN, Levie Heritage, MA, ACSM RCEP, Exercise Physiologist;Amanda Oletta Darter, BA, ACSM CEP, Exercise Physiologist;Meredith Sherryll Burger, RN BSN;Joseph Flavia Shipper   Supervising physician immediately available to respond to emergencies See telemetry face sheet for immediately available ER MD   Medication changes reported     No   Fall or balance concerns reported    No   Warm-up and Cool-down Performed on first and last piece of equipment   Resistance Training Performed Yes   VAD Patient? No     Pain Assessment   Currently in Pain? No/denies   Multiple Pain Sites No         History  Smoking Status  . Never Smoker  Smokeless Tobacco  . Not on file    Goals Met:  Independence with exercise equipment Exercise tolerated well No report of cardiac concerns or symptoms Strength training completed today  Goals Unmet:  Not Applicable  Comments: Pt able to follow exercise prescription today without complaint.  Will continue to monitor for progression.   Dr. Emily Filbert is Medical Director for [place] and [name].

## 2017-08-13 VITALS — Ht 69.7 in | Wt 197.1 lb

## 2017-08-13 DIAGNOSIS — I252 Old myocardial infarction: Secondary | ICD-10-CM | POA: Diagnosis not present

## 2017-08-13 DIAGNOSIS — I214 Non-ST elevation (NSTEMI) myocardial infarction: Secondary | ICD-10-CM

## 2017-08-13 NOTE — Progress Notes (Signed)
Daily Session Note  Patient Details  Name: Adam Cline MRN: [id number] Date of Birth: [birth year] Referring Provider:     Cardiac Rehab from 07/20/2017 in [facility] Cardiac and Pulmonary Rehab  Referring Provider  Serafina Royals MD      Encounter Date: 08/13/2017  Check In:     Session Check In - 08/13/17 1630      Check-In   Location [facility]-Cardiac & Pulmonary Rehab   Staff Present Gerlene Burdock, RN, Vickki Hearing, BA, ACSM CEP, Exercise Physiologist;Kelly Amedeo Plenty, BS, ACSM CEP, Exercise Physiologist;Joseph Flavia Shipper   Supervising physician immediately available to respond to emergencies See telemetry face sheet for immediately available ER MD   Medication changes reported     No   Fall or balance concerns reported    No   Warm-up and Cool-down Performed on first and last piece of equipment   Resistance Training Performed Yes   VAD Patient? No     Pain Assessment   Currently in Pain? No/denies   Multiple Pain Sites No         History  Smoking Status  . Never Smoker  Smokeless Tobacco  . Not on file    Goals Met:  Independence with exercise equipment Exercise tolerated well No report of cardiac concerns or symptoms Strength training completed today  Goals Unmet:  Not Applicable  Comments:      [place] Name 07/20/17 1411 08/13/17 1653       6 Minute Walk   Phase Initial Discharge    Distance 1268 feet 1720 feet    Distance % Change  - 35 %  452    Walk Time 6 minutes 6 minutes    # of Rest Breaks 0 0    MPH 2.4 3.25    METS 3.8 4.67    RPE 9 13    VO2 Peak 13.3 16.3    Symptoms No No    Resting HR 60 bpm 78 bpm    Resting BP 134/60 116/60    Max Ex. HR 99 bpm 106 bpm    Max Ex. BP 162/74 162/72    2 Minute Post BP 126/70  -      Pt able to follow exercise prescription today without complaint.  Will continue to monitor for progression.  Dr. Emily Filbert is Medical Director for [place] and [name].

## 2017-08-17 ENCOUNTER — Encounter: Payer: 59 | Admitting: *Deleted

## 2017-08-17 DIAGNOSIS — I252 Old myocardial infarction: Secondary | ICD-10-CM | POA: Diagnosis not present

## 2017-08-17 DIAGNOSIS — I214 Non-ST elevation (NSTEMI) myocardial infarction: Secondary | ICD-10-CM

## 2017-08-17 NOTE — Progress Notes (Signed)
Daily Session Note  Patient Details  Name: Adam Cline MRN: [id number] Date of Birth: [birth year] Referring Provider:     Cardiac Rehab from 07/20/2017 in [facility] Cardiac and Pulmonary Rehab  Referring Provider  Serafina Royals MD      Encounter Date: 08/17/2017  Check In:     Session Check In - 08/17/17 1634      Check-In   Location [facility]-Cardiac & Pulmonary Rehab   Staff Present Nyoka Cowden, RN, BSN, MA;Krue Peterka Sherryll Burger, RN Moises Blood, BS, ACSM CEP, Exercise Physiologist   Supervising physician immediately available to respond to emergencies See telemetry face sheet for immediately available ER MD   Medication changes reported     No   Fall or balance concerns reported    No   Warm-up and Cool-down Performed on first and last piece of equipment   VAD Patient? No     Pain Assessment   Currently in Pain? No/denies         History  Smoking Status  . Never Smoker  Smokeless Tobacco  . Not on file    Goals Met:  Proper associated with RPD/PD & O2 Sat Independence with exercise equipment Using PLB without cueing & demonstrates good technique Exercise tolerated well No report of cardiac concerns or symptoms Strength training completed today  Goals Unmet:  Not Applicable  Comments: Pt able to follow exercise prescription today without complaint.  Will continue to monitor for progression.    Dr. Emily Filbert is Medical Director for [place] and [name].

## 2017-08-19 ENCOUNTER — Encounter: Payer: 59 | Admitting: *Deleted

## 2017-08-19 DIAGNOSIS — I252 Old myocardial infarction: Secondary | ICD-10-CM | POA: Diagnosis not present

## 2017-08-19 DIAGNOSIS — I214 Non-ST elevation (NSTEMI) myocardial infarction: Secondary | ICD-10-CM

## 2017-08-19 NOTE — Progress Notes (Signed)
Daily Session Note  Patient Details  Name: Adam Cline MRN: [id number] Date of Birth: 02/12/1961 Referring Provider:     Cardiac Rehab from 07/20/2017 in [facility] Cardiac and Pulmonary Rehab  Referring Provider  Serafina Royals MD      Encounter Date: 08/19/2017  Check In:     Session Check In - 08/19/17 1624      Check-In   Location [facility]-Cardiac & Pulmonary Rehab   Staff Present Gerlene Burdock, RN, BSN;Apphia Cropley Sherryll Burger, RN Vickki Hearing, BA, ACSM CEP, Exercise Physiologist   Supervising physician immediately available to respond to emergencies See telemetry face sheet for immediately available ER MD   Medication changes reported     No   Fall or balance concerns reported    No   Warm-up and Cool-down Performed on first and last piece of equipment   Resistance Training Performed Yes   VAD Patient? No     Pain Assessment   Currently in Pain? No/denies         History  Smoking Status  . Never Smoker  Smokeless Tobacco  . Not on file    Goals Met:  Proper associated with RPD/PD & O2 Sat Independence with exercise equipment Exercise tolerated well Personal goals reviewed No report of cardiac concerns or symptoms Strength training completed today  Goals Unmet:  Not Applicable  Comments:  Adam Cline graduated today from cardiac rehab with 21 sessions completed.  Details of the patient's exercise prescription and what He needs to do in order to continue the prescription and progress were discussed with patient.  Patient was given a copy of prescription and goals.  Patient verbalized understanding.  Adam Cline plans to continue to exercise by attending [employer].    Dr. Emily Filbert is Medical Director for [place] and [name].

## 2017-08-19 NOTE — Patient Instructions (Signed)
Discharge Instructions  Patient Details  Name: Adam Cline MRN: [id number] Date of Birth: 04/06/1961 Referring Provider:  Corey Skains, MD   Number of Visits: 22  Reason for Discharge:  Early Exit:  Personal  Smoking History:  History  Smoking Status  . Never Smoker  Smokeless Tobacco  . Not on file    Diagnosis:  NSTEMI (non-ST elevated myocardial infarction) [facility])  Initial Exercise Prescription:     Initial Exercise Prescription - 07/20/17 1400      Date of Initial Exercise RX and Referring Provider   Date 07/20/17   Referring Provider Serafina Royals MD     Treadmill   MPH 3   Grade 1   Minutes 15   METs 3.71     REL-XR   Level 2   Speed 50   Minutes 15   METs 2     T5 Nustep   Level 3   SPM 80   Minutes 15   METs 2     Prescription Details   Frequency (times per week) 3   Duration Progress to 45 minutes of aerobic exercise without signs/symptoms of physical distress     Intensity   THRR 40-80% of Max Heartrate 102-143   Ratings of Perceived Exertion 11-13   Perceived Dyspnea 0-4     Progression   Progression Continue to progress workloads to maintain intensity without signs/symptoms of physical distress.     Resistance Training   Training Prescription Yes   Weight 4 lbs   Reps 10-15      Discharge Exercise Prescription (Final Exercise Prescription Changes):     Exercise Prescription Changes - 08/06/17 1100      Response to Exercise   Blood Pressure (Admit) 128/68   Blood Pressure (Exercise) 146/72   Blood Pressure (Exit) 104/64   Heart Rate (Admit) 96 bpm   Heart Rate (Exercise) 105 bpm   Heart Rate (Exit) 75 bpm   Rating of Perceived Exertion (Exercise) 12   Symptoms none   Duration Continue with 45 min of aerobic exercise without signs/symptoms of physical distress.   Intensity THRR unchanged     Progression   Progression Continue to progress workloads to maintain intensity without signs/symptoms of physical distress.    Average METs 3.34     Resistance Training   Training Prescription Yes   Weight 4 lbs   Reps 10-15     Interval Training   Interval Training No     Treadmill   MPH 3   Grade 1   Minutes 15   METs 3.71     REL-XR   Level 2   Minutes 15   METs 3.9     T5 Nustep   Level 3   Minutes 15   METs 2.4     Home Exercise Plan   Plans to continue exercise at Home (comment)  plans to walk at home 2 days a week   Frequency Add 2 additional days to program exercise sessions.   Initial Home Exercises Provided 07/30/17      Functional Capacity:     6 Minute Walk    [place] Name 07/20/17 1411 08/13/17 1653       6 Minute Walk   Phase Initial Discharge    Distance 1268 feet 1720 feet    Distance % Change  - 35 %  452    Walk Time 6 minutes 6 minutes    # of Rest Breaks 0 0    MPH  2.4 3.25    METS 3.8 4.67    RPE 9 13    VO2 Peak 13.3 16.3    Symptoms No No    Resting HR 60 bpm 78 bpm    Resting BP 134/60 116/60    Max Ex. HR 99 bpm 106 bpm    Max Ex. BP 162/74 162/72    2 Minute Post BP 126/70  -       Quality of Life:     Quality of Life - 08/17/17 1630      Quality of Life Scores   Health/Function Post 20.47 %   Socioeconomic Post 20.13 %   Psych/Spiritual Post 20.14 %   Family Post 20.5 %   GLOBAL Post 20.32 %      Personal Goals: Goals established at orientation with interventions provided to work toward goal.     Personal Goals and Risk Factors at Admission - 07/20/17 1241      Core Components/Risk Factors/Patient Goals on Admission    Weight Management Weight Loss;Yes   Intervention Weight Management: Develop a combined nutrition and exercise program designed to reach desired caloric intake, while maintaining appropriate intake of nutrient and fiber, sodium and fats, and appropriate energy expenditure required for the weight goal.;Weight Management: Provide education and appropriate resources to help participant work on and attain dietary goals.    Admit Weight 197 lb 9.6 oz (89.6 kg)   Goal Weight: Short Term 192 lb (87.1 kg)   Goal Weight: Long Term 185 lb (83.9 kg)   Expected Outcomes Short Term: Continue to assess and modify interventions until short term weight is achieved;Weight Loss: Understanding of general recommendations for a balanced deficit meal plan, which promotes 1-2 lb weight loss per week and includes a negative energy balance of [phone] kcal/d;Long Term: Adherence to nutrition and physical activity/exercise program aimed toward attainment of established weight goal;Understanding recommendations for meals to include 15-35% energy as protein, 25-35% energy from fat, 35-60% energy from carbohydrates, less than 200mg  of dietary cholesterol, 20-35 gm of total fiber daily;Understanding of distribution of calorie intake throughout the day with the consumption of 4-5 meals/snacks   Hypertension Yes   Intervention Provide education on lifestyle modifcations including regular physical activity/exercise, weight management, moderate sodium restriction and increased consumption of fresh fruit, vegetables, and low fat dairy, alcohol moderation, and smoking cessation.;Monitor prescription use compliance.   Expected Outcomes Short Term: Continued assessment and intervention until BP is < 140/67mm HG in hypertensive participants. < 130/40mm HG in hypertensive participants with diabetes, heart failure or chronic kidney disease.;Long Term: Maintenance of blood pressure at goal levels.   Lipids Yes   Intervention Provide education and support for participant on nutrition & aerobic/resistive exercise along with prescribed medications to achieve LDL 70mg , HDL >40mg .   Expected Outcomes Long Term: Cholesterol controlled with medications as prescribed, with individualized exercise RX and with personalized nutrition plan. Value goals: LDL < 70mg , HDL > 40 mg.;Short Term: Participant states understanding of desired cholesterol values and is compliant with  medications prescribed. Participant is following exercise prescription and nutrition guidelines.   Stress Yes   Intervention Offer individual and/or small group education and counseling on adjustment to heart disease, stress management and health-related lifestyle change. Teach and support self-help strategies.;Refer participants experiencing significant psychosocial distress to appropriate mental health specialists for further evaluation and treatment. When possible, include family members and significant others in education/counseling sessions.   Expected Outcomes Short Term: Participant demonstrates changes in health-related behavior, relaxation and  other stress management skills, ability to obtain effective social support, and compliance with psychotropic medications if prescribed.;Long Term: Emotional wellbeing is indicated by absence of clinically significant psychosocial distress or social isolation.       Personal Goals Discharge:   Nutrition & Weight - Outcomes:      Post Biometrics - 08/13/17 1654       Post  Biometrics   Height 5' 9.7" (1.77 m)   Weight 197 lb 1.6 oz (89.4 kg)   Waist Circumference 39 inches   Hip Circumference 41 inches   Waist to Hip Ratio 0.95 %   BMI (Calculated) 28.6      Nutrition:     Nutrition Therapy & Goals - 08/06/17 1800      Nutrition Therapy   Diet TLC   Protein (specify units) 11oz   Whole Grain Foods 3 servings   Saturated Fats 14 max. grams   Fruits and Vegetables 8 servings/day   Sodium 1500 grams     Personal Nutrition Goals   Nutrition Goal Continue with weight loss efforts by following 1800 calorie meal plan   Personal Goal #2 Keep working on portion control of foods, expecially starches and cheese. Eat plenty of vegetables and fruits.    Personal Goal #3 check food labels for sodium and keep trying more low sodium choices.    Comments Mr. Cubero has lost about 10-15lbs since working on diet and lifestyle changes. He reports  he has significantly reduced his intake of pasta and breads, as well as beer. Weight loss has hit a plateau for now, but he will continue to work on food portions and low fat, low sodium choices.      Intervention Plan   Intervention Nutrition handout(s) given to patient.   Expected Outcomes Short Term Goal: A plan has been developed with personal nutrition goals set during dietitian appointment.;Long Term Goal: Adherence to prescribed nutrition plan.      Nutrition Discharge:     Nutrition Assessments - 08/17/17 1629      MEDFICTS Scores   Post Score 25      Education Questionnaire Score:     Knowledge Questionnaire Score - 08/17/17 1630      Knowledge Questionnaire Score   Post Score 26/28      Goals reviewed with patient; copy given to patient.

## 2017-08-19 NOTE — Progress Notes (Signed)
Cardiac Individual Treatment Plan  Patient Details  Name: Adam Cline MRN: [id number] Date of Birth: [birth year] Referring Provider:     Cardiac Rehab from 07/20/2017 in [facility] Cardiac and Pulmonary Rehab  Referring Provider  Serafina Royals MD      Initial Encounter Date:    Cardiac Rehab from 07/20/2017 in [facility] Cardiac and Pulmonary Rehab  Date  07/20/17  Referring Provider  Serafina Royals MD      Visit Diagnosis: NSTEMI (non-ST elevated myocardial infarction) [facility])  Patient's Home Medications on Admission:  Current Outpatient Prescriptions:  .  aspirin EC 81 MG tablet, 1 tablet once daily., Disp: , Rfl:  .  atorvastatin (LIPITOR) 80 MG tablet, Take by mouth., Disp: , Rfl:  .  fluticasone (FLONASE) 50 MCG/ACT nasal spray, Place into the nose., Disp: , Rfl:  .  lisinopril (PRINIVIL,ZESTRIL) 10 MG tablet, Take by mouth., Disp: , Rfl:  .  metoprolol tartrate (LOPRESSOR) 25 MG tablet, Take by mouth., Disp: , Rfl:  .  nitroGLYCERIN (NITROSTAT) 0.4 MG SL tablet, as needed., Disp: , Rfl:  .  ticagrelor (BRILINTA) 90 MG TABS tablet, Take by mouth., Disp: , Rfl:   Past Medical History: No past medical history on file.  Tobacco Use: History  Smoking Status  . Never Smoker  Smokeless Tobacco  . Not on file    Labs: Recent Review Flowsheet Data    There is no flowsheet data to display.       Exercise Target Goals:    Exercise Program Goal: Individual exercise prescription set with THRR, safety & activity barriers. Participant demonstrates ability to understand and report RPE using BORG scale, to self-measure pulse accurately, and to acknowledge the importance of the exercise prescription.  Exercise Prescription Goal: Starting with aerobic activity 30 plus minutes a day, 3 days per week for initial exercise prescription. Provide home exercise prescription and guidelines that participant acknowledges understanding prior to discharge.  Activity Barriers & Risk  Stratification:     Activity Barriers & Cardiac Risk Stratification - 07/20/17 1240      Activity Barriers & Cardiac Risk Stratification   Activity Barriers Joint Problems;Deconditioning;Muscular Weakness  knee achy, R shoulder limited ROM   Cardiac Risk Stratification High      6 Minute Walk:     6 Minute Walk    Row Name 07/20/17 1411 08/13/17 1653       6 Minute Walk   Phase Initial Discharge    Distance 1268 feet 1720 feet    Distance % Change  - 35 %  452    Walk Time 6 minutes 6 minutes    # of Rest Breaks 0 0    MPH 2.4 3.25    METS 3.8 4.67    RPE 9 13    VO2 Peak 13.3 16.3    Symptoms No No    Resting HR 60 bpm 78 bpm    Resting BP 134/60 116/60    Max Ex. HR 99 bpm 106 bpm    Max Ex. BP 162/74 162/72    2 Minute Post BP 126/70  -       Oxygen Initial Assessment:   Oxygen Re-Evaluation:   Oxygen Discharge (Final Oxygen Re-Evaluation):   Initial Exercise Prescription:     Initial Exercise Prescription - 07/20/17 1400      Date of Initial Exercise RX and Referring Provider   Date 07/20/17   Referring Provider Serafina Royals MD     Treadmill   MPH 3  Grade 1   Minutes 15   METs 3.71     REL-XR   Level 2   Speed 50   Minutes 15   METs 2     T5 Nustep   Level 3   SPM 80   Minutes 15   METs 2     Prescription Details   Frequency (times per week) 3   Duration Progress to 45 minutes of aerobic exercise without signs/symptoms of physical distress     Intensity   THRR 40-80% of Max Heartrate 102-143   Ratings of Perceived Exertion 11-13   Perceived Dyspnea 0-4     Progression   Progression Continue to progress workloads to maintain intensity without signs/symptoms of physical distress.     Resistance Training   Training Prescription Yes   Weight 4 lbs   Reps 10-15      Perform Capillary Blood Glucose checks as needed.  Exercise Prescription Changes:     Exercise Prescription Changes    Row Name 07/20/17 1400 07/24/17  1100 07/30/17 1600 08/06/17 1100       Response to Exercise   Blood Pressure (Admit) 134/60 124/70  - 128/68    Blood Pressure (Exercise) 162/74 134/82  - 146/72    Blood Pressure (Exit) 126/70 122/80  - 104/64    Heart Rate (Admit) 60 bpm 94 bpm  - 96 bpm    Heart Rate (Exercise) 99 bpm 117 bpm  - 105 bpm    Heart Rate (Exit) 62 bpm 72 bpm  - 75 bpm    Oxygen Saturation (Admit) 100 %  -  -  -    Oxygen Saturation (Exercise) 100 %  -  -  -    Rating of Perceived Exertion (Exercise) 9 13  - 12    Symptoms none none  - none    Comments walk test results  -  -  -    Duration  - Progress to 45 minutes of aerobic exercise without signs/symptoms of physical distress Progress to 45 minutes of aerobic exercise without signs/symptoms of physical distress Continue with 45 min of aerobic exercise without signs/symptoms of physical distress.    Intensity  - THRR unchanged THRR unchanged THRR unchanged      Progression   Progression  - Continue to progress workloads to maintain intensity without signs/symptoms of physical distress. Continue to progress workloads to maintain intensity without signs/symptoms of physical distress. Continue to progress workloads to maintain intensity without signs/symptoms of physical distress.    Average METs  - 3.75 3.75 3.34      Resistance Training   Training Prescription  - Yes Yes Yes    Weight  - 4 lbs 4 lbs 4 lbs    Reps  - 10-15 10-15 10-15      Interval Training   Interval Training  - No No No      Treadmill   MPH  - _0 Grade  - _1 Minutes  - _2 METs  - 3.71 3.71 3.71      REL-XR   Level  - _3 Speed  - 50 50  -    Minutes  - _4 METs  - 3.8 3.8 3.9      T5 Nustep   Level  -  -  - 3    Minutes  -  -  -  15    METs  -  -  - 2.4      Home Exercise Plan   Plans to continue exercise at  -  - Home (comment)  plans to walk at home 2 days a week Home (comment)  plans to walk at home 2 days a week    Frequency  -   - Add 2 additional days to program exercise sessions. Add 2 additional days to program exercise sessions.    Initial Home Exercises Provided  -  - 07/30/17 07/30/17       Exercise Comments:     Exercise Comments    Row Name 07/30/17 1643 08/13/17 1655         Exercise Comments Home Exercise reviewed with patient. 6 MWT completed and Reviewed with patient.         Exercise Goals and Review:     Exercise Goals    Row Name 07/20/17 1414             Exercise Goals   Increase Physical Activity Yes       Intervention Provide advice, education, support and counseling about physical activity/exercise needs.;Develop an individualized exercise prescription for aerobic and resistive training based on initial evaluation findings, risk stratification, comorbidities and participant's personal goals.       Expected Outcomes Achievement of increased cardiorespiratory fitness and enhanced flexibility, muscular endurance and strength shown through measurements of functional capacity and personal statement of participant.       Increase Strength and Stamina Yes       Intervention Provide advice, education, support and counseling about physical activity/exercise needs.;Develop an individualized exercise prescription for aerobic and resistive training based on initial evaluation findings, risk stratification, comorbidities and participant's personal goals.       Expected Outcomes Achievement of increased cardiorespiratory fitness and enhanced flexibility, muscular endurance and strength shown through measurements of functional capacity and personal statement of participant.          Exercise Goals Re-Evaluation :     Exercise Goals Re-Evaluation    Row Name 07/30/17 1655 08/06/17 1114           Exercise Goal Re-Evaluation   Exercise Goals Review Increase Physical Activity;Increase Strenth and Stamina Increase Physical Activity;Increase Strenth and Stamina      Comments Home exercise  reviewed with patient. Per plans to walk 2 days a week when not in Rehab. Ediberto is off to a good start in rehab.  He has started to add walking in on his off days.  We will continue to monitor his progression.      Expected Outcomes Short: Pranshu will walk two days a week at home. Long: Kham will increase his walking to more than 2 days week Short: Continue to move up workloads.  Long: Make exercise part of daily habit.         Discharge Exercise Prescription (Final Exercise Prescription Changes):     Exercise Prescription Changes - 08/06/17 1100      Response to Exercise   Blood Pressure (Admit) 128/68   Blood Pressure (Exercise) 146/72   Blood Pressure (Exit) 104/64   Heart Rate (Admit) 96 bpm   Heart Rate (Exercise) 105 bpm   Heart Rate (Exit) 75 bpm   Rating of Perceived Exertion (Exercise) 12   Symptoms none   Duration Continue with 45 min of aerobic exercise without signs/symptoms of physical distress.   Intensity THRR unchanged     Progression   Progression Continue  to progress workloads to maintain intensity without signs/symptoms of physical distress.   Average METs 3.34     Resistance Training   Training Prescription Yes   Weight 4 lbs   Reps 10-15     Interval Training   Interval Training No     Treadmill   MPH 3   Grade 1   Minutes 15   METs 3.71     REL-XR   Level 2   Minutes 15   METs 3.9     T5 Nustep   Level 3   Minutes 15   METs 2.4     Home Exercise Plan   Plans to continue exercise at Home (comment)  plans to walk at home 2 days a week   Frequency Add 2 additional days to program exercise sessions.   Initial Home Exercises Provided 07/30/17      Nutrition:  Target Goals: Understanding of nutrition guidelines, daily intake of sodium <1585m, cholesterol <2011m calories 30% from fat and 7% or less from saturated fats, daily to have 5 or more servings of fruits and vegetables.  Biometrics:      Post Biometrics - 08/13/17 1654        Post  Biometrics   Height 5' 9.7" (1.77 m)   Weight 197 lb 1.6 oz (89.4 kg)   Waist Circumference 39 inches   Hip Circumference 41 inches   Waist to Hip Ratio 0.95 %   BMI (Calculated) 28.6      Nutrition Therapy Plan and Nutrition Goals:     Nutrition Therapy & Goals - 08/06/17 1800      Nutrition Therapy   Diet TLC   Protein (specify units) 11oz   Whole Grain Foods 3 servings   Saturated Fats 14 max. grams   Fruits and Vegetables 8 servings/day   Sodium 1500 grams     Personal Nutrition Goals   Nutrition Goal Continue with weight loss efforts by following 1800 calorie meal plan   Personal Goal #2 Keep working on portion control of foods, expecially starches and cheese. Eat plenty of vegetables and fruits.    Personal Goal #3 check food labels for sodium and keep trying more low sodium choices.    Comments Mr. ZaGraseras lost about 10-15lbs since working on diet and lifestyle changes. He reports he has significantly reduced his intake of pasta and breads, as well as beer. Weight loss has hit a plateau for now, but he will continue to work on food portions and low fat, low sodium choices.      Intervention Plan   Intervention Nutrition handout(s) given to patient.   Expected Outcomes Short Term Goal: A plan has been developed with personal nutrition goals set during dietitian appointment.;Long Term Goal: Adherence to prescribed nutrition plan.      Nutrition Discharge: Rate Your Plate Scores:     Nutrition Assessments - 08/17/17 1629      MEDFICTS Scores   Post Score 25      Nutrition Goals Re-Evaluation:   Nutrition Goals Discharge (Final Nutrition Goals Re-Evaluation):   Psychosocial: Target Goals: Acknowledge presence or absence of significant depression and/or stress, maximize coping skills, provide positive support system. Participant is able to verbalize types and ability to use techniques and skills needed for reducing stress and depression.   Initial  Review & Psychosocial Screening:     Initial Psych Review & Screening - 07/20/17 1242      Initial Review   Current issues with Current Stress  Concerns   Source of Stress Concerns Occupation   Comments "Plant Manager/Machinist"      Quality of Life Scores:      Quality of Life - 08/17/17 1630      Quality of Life Scores   Health/Function Post 20.47 %   Socioeconomic Post 20.13 %   Psych/Spiritual Post 20.14 %   Family Post 20.5 %   GLOBAL Post 20.32 %      PHQ-9: Recent Review Flowsheet Data    Depression screen [facility] 2/9 08/17/2017 07/20/2017   Decreased Interest 0 0   Down, Depressed, Hopeless 0 0   PHQ - 2 Score 0 0   Altered sleeping 0 0   Tired, decreased energy 1 1   Change in appetite 0 0   Feeling bad or failure about yourself  0 0   Trouble concentrating 0 0   Moving slowly or fidgety/restless 0 0   Suicidal thoughts 0 0   PHQ-9 Score 1 1   Difficult doing work/chores Not difficult at all Not difficult at all     Interpretation of Total Score  Total Score Depression Severity:  1-4 = Minimal depression, 5-9 = Mild depression, 10-14 = Moderate depression, 15-19 = Moderately severe depression, 20-27 = Severe depression   Psychosocial Evaluation and Intervention:     Psychosocial Evaluation - 07/27/17 1704      Psychosocial Evaluation & Interventions   Interventions Encouraged to exercise with the program and follow exercise prescription;Stress management education;Relaxation education   Comments Counselor met with Mr. Balogh Zebrowski) today for initial psychosocial evaluation.  He is a 56 year old who had a mild heart attack while visiting family in [place] on Christmas day of 2017.  He had two stents inserted the next day.  Daniele has a strong support system with a spouse of 21 years; active involvement in his local church community; and a supportive work environment.  Franke has no other health issues and states he sleeps well and has a "wonderful" appetite.  Humzah denies  a history of depression or anxiety or any current symptoms and states he is typically in a positive mood most of the time.  Vanderbilt listed his stressors as being his job and finances.  He copes by talking to his spouse and tries to just "let it go."  Yann has goals to lose weight and increase his energy while in this program.  He will be followed by staff throughout the course of this class.    Expected Outcomes Kennard will benefit from consistent exercise to achieve his stated goals for this program.  A meeting with the dietician will be helpful in addressing his weight loss goals.  He will also benefit from the psychoeducational components of this program to help develop positive coping skills and learn to manage stress with exercise and relaxation.   Continue Psychosocial Services  Follow up required by staff      Psychosocial Re-Evaluation:     Psychosocial Re-Evaluation    [place] Name 08/17/17 1733             Psychosocial Re-Evaluation   Comments Counselor follow up with Antony Haste reporting feeling stronger and better since coming into this program.  He also is learning a great deal with the educational components of this program.  Counselor commended Chas for his progress made and commitment to consistent exercise.            Psychosocial Discharge (Final Psychosocial Re-Evaluation):     Psychosocial Re-Evaluation -  08/17/17 1733      Psychosocial Re-Evaluation   Comments Counselor follow up with Antony Haste reporting feeling stronger and better since coming into this program.  He also is learning a great deal with the educational components of this program.  Counselor commended Reginaldo for his progress made and commitment to consistent exercise.        Vocational Rehabilitation: Provide vocational rehab assistance to qualifying candidates.   Vocational Rehab Evaluation & Intervention:     Vocational Rehab - 07/20/17 1237      Initial Vocational Rehab Evaluation & Intervention   Assessment  shows need for Vocational Rehabilitation No      Education: Education Goals: Education classes will be provided on a weekly basis, covering required topics. Participant will state understanding/return demonstration of topics presented.  Learning Barriers/Preferences:     Learning Barriers/Preferences - 07/20/17 1237      Learning Barriers/Preferences   Learning Barriers None   Learning Preferences None      Education Topics: General Nutrition Guidelines/Fats and Fiber: -Group instruction provided by verbal, written material, models and posters to present the general guidelines for heart healthy nutrition. Gives an explanation and review of dietary fats and fiber.   Cardiac Rehab from 08/19/2017 in [facility] Cardiac and Pulmonary Rehab  Date  08/17/17  Educator  PI  Instruction Review Code  2- meets goals/outcomes      Controlling Sodium/Reading Food Labels: -Group verbal and written material supporting the discussion of sodium use in heart healthy nutrition. Review and explanation with models, verbal and written materials for utilization of the food label.   Exercise Physiology & Risk Factors: - Group verbal and written instruction with models to review the exercise physiology of the cardiovascular system and associated critical values. Details cardiovascular disease risk factors and the goals associated with each risk factor.   Aerobic Exercise & Resistance Training: - Gives group verbal and written discussion on the health impact of inactivity. On the components of aerobic and resistive training programs and the benefits of this training and how to safely progress through these programs.   Flexibility, Balance, General Exercise Guidelines: - Provides group verbal and written instruction on the benefits of flexibility and balance training programs. Provides general exercise guidelines with specific guidelines to those with heart or lung disease. Demonstration and skill practice  provided.   Stress Management: - Provides group verbal and written instruction about the health risks of elevated stress, cause of high stress, and healthy ways to reduce stress.   Cardiac Rehab from 08/19/2017 in [facility] Cardiac and Pulmonary Rehab  Date  07/22/17  Educator  [facility]  Instruction Review Code  2- meets goals/outcomes      Depression: - Provides group verbal and written instruction on the correlation between heart/lung disease and depressed mood, treatment options, and the stigmas associated with seeking treatment.   Cardiac Rehab from 08/19/2017 in [facility] Cardiac and Pulmonary Rehab  Date  08/19/17  Educator  [facility]  Instruction Review Code  2- meets goals/outcomes      Anatomy & Physiology of the Heart: - Group verbal and written instruction and models provide basic cardiac anatomy and physiology, with the coronary electrical and arterial systems. Review of: AMI, Angina, Valve disease, Heart Failure, Cardiac Arrhythmia, Pacemakers, and the ICD.   Cardiac Procedures: - Group verbal and written instruction and models to describe the testing methods done to diagnose heart disease. Reviews the outcomes of the test results. Describes the treatment choices: Medical Management, Angioplasty, or Coronary Bypass  Surgery.   Cardiac Rehab from 08/19/2017 in [facility] Cardiac and Pulmonary Rehab  Date  07/27/17  Educator  [facility]  Instruction Review Code  2- meets goals/outcomes      Cardiac Medications: - Group verbal and written instruction to review commonly prescribed medications for heart disease. Reviews the medication, class of the drug, and side effects. Includes the steps to properly store meds and maintain the prescription regimen.   Cardiac Rehab from 08/19/2017 in [facility] Cardiac and Pulmonary Rehab  Date  08/03/17 [Part 2 08/05/17]  Educator  MJA, RN [Part 2 SB]  Instruction Review Code  2- meets goals/outcomes      Go Sex-Intimacy & Heart Disease, Get SMART - Goal Setting: - Group verbal  and written instruction through game format to discuss heart disease and the return to sexual intimacy. Provides group verbal and written material to discuss and apply goal setting through the application of the S.M.A.R.T. Method.   Cardiac Rehab from 08/19/2017 in [facility] Cardiac and Pulmonary Rehab  Date  07/27/17  Educator  [facility]  Instruction Review Code  2- meets goals/outcomes      Other Matters of the Heart: - Provides group verbal, written materials and models to describe Heart Failure, Angina, Valve Disease, and Diabetes in the realm of heart disease. Includes description of the disease process and treatment options available to the cardiac patient.   Exercise & Equipment Safety: - Individual verbal instruction and demonstration of equipment use and safety with use of the equipment.   Cardiac Rehab from 08/19/2017 in [facility] Cardiac and Pulmonary Rehab  Date  07/20/17  Educator  C. EnterkinRN  Instruction Review Code  1- partially meets, needs review/practice      Infection Prevention: - Provides verbal and written material to individual with discussion of infection control including proper hand washing and proper equipment cleaning during exercise session.   Cardiac Rehab from 08/19/2017 in [facility] Cardiac and Pulmonary Rehab  Date  07/20/17  Educator  C. [place]  Instruction Review Code  1- partially meets, needs review/practice      Falls Prevention: - Provides verbal and written material to individual with discussion of falls prevention and safety.   Cardiac Rehab from 08/19/2017 in [facility] Cardiac and Pulmonary Rehab  Date  07/20/17  Educator  C. [place]  Instruction Review Code  1- partially meets, needs review/practice      Diabetes: - Individual verbal and written instruction to review signs/symptoms of diabetes, desired ranges of glucose level fasting, after meals and with exercise. Advice that pre and post exercise glucose checks will be done for 3 sessions at entry of  program.    Knowledge Questionnaire Score:     Knowledge Questionnaire Score - 08/17/17 1630      Knowledge Questionnaire Score   Post Score 26/28      Core Components/Risk Factors/Patient Goals at Admission:     Personal Goals and Risk Factors at Admission - 07/20/17 1241      Core Components/Risk Factors/Patient Goals on Admission    Weight Management Weight Loss;Yes   Intervention Weight Management: Develop a combined nutrition and exercise program designed to reach desired caloric intake, while maintaining appropriate intake of nutrient and fiber, sodium and fats, and appropriate energy expenditure required for the weight goal.;Weight Management: Provide education and appropriate resources to help participant work on and attain dietary goals.   Admit Weight 197 lb 9.6 oz (89.6 kg)   Goal Weight: Short Term 192 lb (87.1 kg)   Goal Weight:  Long Term 185 lb (83.9 kg)   Expected Outcomes Short Term: Continue to assess and modify interventions until short term weight is achieved;Weight Loss: Understanding of general recommendations for a balanced deficit meal plan, which promotes 1-2 lb weight loss per week and includes a negative energy balance of [phone] kcal/d;Long Term: Adherence to nutrition and physical activity/exercise program aimed toward attainment of established weight goal;Understanding recommendations for meals to include 15-35% energy as protein, 25-35% energy from fat, 35-60% energy from carbohydrates, less than 294m of dietary cholesterol, 20-35 gm of total fiber daily;Understanding of distribution of calorie intake throughout the day with the consumption of 4-5 meals/snacks   Hypertension Yes   Intervention Provide education on lifestyle modifcations including regular physical activity/exercise, weight management, moderate sodium restriction and increased consumption of fresh fruit, vegetables, and low fat dairy, alcohol moderation, and smoking cessation.;Monitor  prescription use compliance.   Expected Outcomes Short Term: Continued assessment and intervention until BP is < 140/971mHG in hypertensive participants. < 130/8045mG in hypertensive participants with diabetes, heart failure or chronic kidney disease.;Long Term: Maintenance of blood pressure at goal levels.   Lipids Yes   Intervention Provide education and support for participant on nutrition & aerobic/resistive exercise along with prescribed medications to achieve LDL <14m94mDL >40mg65mExpected Outcomes Long Term: Cholesterol controlled with medications as prescribed, with individualized exercise RX and with personalized nutrition plan. Value goals: LDL < 14mg,38m > 40 mg.;Short Term: Participant states understanding of desired cholesterol values and is compliant with medications prescribed. Participant is following exercise prescription and nutrition guidelines.   Stress Yes   Intervention Offer individual and/or small group education and counseling on adjustment to heart disease, stress management and health-related lifestyle change. Teach and support self-help strategies.;Refer participants experiencing significant psychosocial distress to appropriate mental health specialists for further evaluation and treatment. When possible, include family members and significant others in education/counseling sessions.   Expected Outcomes Short Term: Participant demonstrates changes in health-related behavior, relaxation and other stress management skills, ability to obtain effective social support, and compliance with psychotropic medications if prescribed.;Long Term: Emotional wellbeing is indicated by absence of clinically significant psychosocial distress or social isolation.      Core Components/Risk Factors/Patient Goals Review:    Core Components/Risk Factors/Patient Goals at Discharge (Final Review):    ITP Comments:     ITP Comments    Row Name 07/20/17 1238 08/12/17 0649         ITP  Comments ITP Created during Medical review after Cardiac Rehab informed consent was signed today at 10:22am. NSTEMI diagnosis documented [facility]  30 day review. Continue with ITP unless directed changes per Medical Director review   New to program         Comments: Discharge ITP

## 2017-08-19 NOTE — Progress Notes (Signed)
Discharge Summary  Patient Details  Name: Adam Cline MRN: [id number] Date of Birth: 06-02-1961 Referring Provider:     Cardiac Rehab from 07/20/2017 in [facility]  Referring Provider  Serafina Royals MD       Number of Visits: 21  Reason for Discharge:  Early Exit:  Insurance  Smoking History:  History  Smoking Status  . Never Smoker  Smokeless Tobacco  . Not on file    Diagnosis:  NSTEMI (non-ST elevated myocardial infarction) ([place])  ADL [facility]:   Initial Exercise Prescription:     Initial Exercise Prescription - 07/20/17 1400      Date of Initial Exercise RX and Referring Provider   Date 07/20/17   Referring Provider Serafina Royals MD     Treadmill   MPH 3   Grade 1   Minutes 15   METs 3.71     REL-XR   Level 2   Speed 50   Minutes 15   METs 2     T5 Nustep   Level 3   SPM 80   Minutes 15   METs 2     Prescription Details   Frequency (times per week) 3   Duration Progress to 45 minutes of aerobic exercise without signs/symptoms of physical distress     Intensity   THRR 40-80% of Max Heartrate 102-143   Ratings of Perceived Exertion 11-13   Perceived Dyspnea 0-4     Progression   Progression Continue to progress workloads to maintain intensity without signs/symptoms of physical distress.     Resistance Training   Training Prescription Yes   Weight 4 lbs   Reps 10-15      Discharge Exercise Prescription (Final Exercise Prescription Changes):     Exercise Prescription Changes - 08/06/17 1100      Response to Exercise   Blood Pressure (Admit) 128/68   Blood Pressure (Exercise) 146/72   Blood Pressure (Exit) 104/64   Heart Rate (Admit) 96 bpm   Heart Rate (Exercise) 105 bpm   Heart Rate (Exit) 75 bpm   Rating of Perceived Exertion (Exercise) 12   Symptoms none   Duration Continue with 45 min of aerobic exercise without signs/symptoms of physical distress.   Intensity THRR unchanged     Progression   Progression Continue to progress workloads to maintain intensity without signs/symptoms of physical distress.   Average METs 3.34     Resistance Training   Training Prescription Yes   Weight 4 lbs   Reps 10-15     Interval Training   Interval Training No     Treadmill   MPH 3   Grade 1   Minutes 15   METs 3.71     REL-XR   Level 2   Minutes 15   METs 3.9     T5 Nustep   Level 3   Minutes 15   METs 2.4     Home Exercise Plan   Plans to continue exercise at Home (comment)  plans to walk at home 2 days a week   Frequency Add 2 additional days to program exercise sessions.   Initial Home Exercises Provided 07/30/17      Functional Capacity:     6 Minute Walk    Row Name 07/20/17 1411 08/13/17 1653       6 Minute Walk   Phase Initial Discharge    Distance 1268 feet 1720 feet    Distance % Change  - 35 %  452    Walk Time 6 minutes 6 minutes    # of Rest Breaks 0 0    MPH 2.4 3.25    METS 3.8 4.67    RPE 9 13    VO2 Peak 13.3 16.3    Symptoms No No    Resting HR 60 bpm 78 bpm    Resting BP 134/60 116/60    Max Ex. HR 99 bpm 106 bpm    Max Ex. BP 162/74 162/72    2 Minute Post BP 126/70  -       Psychological, QOL, Others - Outcomes: PHQ 2/9: Depression screen [facility] 2/9 08/17/2017 07/20/2017  Decreased Interest 0 0  Down, Depressed, Hopeless 0 0  PHQ - 2 Score 0 0  Altered sleeping 0 0  Tired, decreased energy 1 1  Change in appetite 0 0  Feeling bad or failure about yourself  0 0  Trouble concentrating 0 0  Moving slowly or fidgety/restless 0 0  Suicidal thoughts 0 0  PHQ-9 Score 1 1  Difficult doing work/chores Not difficult at all Not difficult at all    Quality of Life:     Quality of Life - 08/17/17 1630      Quality of Life Scores   Health/Function Post 20.47 %   Socioeconomic Post 20.13 %   Psych/Spiritual Post 20.14 %   Family Post 20.5 %   GLOBAL Post 20.32 %      Personal Goals: Goals established at orientation with  interventions provided to work toward goal.     Personal Goals and Risk Factors at Admission - 07/20/17 1241      Core Components/Risk Factors/Patient Goals on Admission    Weight Management Weight Loss;Yes   Intervention Weight Management: Develop a combined nutrition and exercise program designed to reach desired caloric intake, while maintaining appropriate intake of nutrient and fiber, sodium and fats, and appropriate energy expenditure required for the weight goal.;Weight Management: Provide education and appropriate resources to help participant work on and attain dietary goals.   Admit Weight 197 lb 9.6 oz (89.6 kg)   Goal Weight: Short Term 192 lb (87.1 kg)   Goal Weight: Long Term 185 lb (83.9 kg)   Expected Outcomes Short Term: Continue to assess and modify interventions until short term weight is achieved;Weight Loss: Understanding of general recommendations for a balanced deficit meal plan, which promotes 1-2 lb weight loss per week and includes a negative energy balance of [phone] kcal/d;Long Term: Adherence to nutrition and physical activity/exercise program aimed toward attainment of established weight goal;Understanding recommendations for meals to include 15-35% energy as protein, 25-35% energy from fat, 35-60% energy from carbohydrates, less than 200mg  of dietary cholesterol, 20-35 gm of total fiber daily;Understanding of distribution of calorie intake throughout the day with the consumption of 4-5 meals/snacks   Hypertension Yes   Intervention Provide education on lifestyle modifcations including regular physical activity/exercise, weight management, moderate sodium restriction and increased consumption of fresh fruit, vegetables, and low fat dairy, alcohol moderation, and smoking cessation.;Monitor prescription use compliance.   Expected Outcomes Short Term: Continued assessment and intervention until BP is < 140/73mm HG in hypertensive participants. < 130/57mm HG in hypertensive  participants with diabetes, heart failure or chronic kidney disease.;Long Term: Maintenance of blood pressure at goal levels.   Lipids Yes   Intervention Provide education and support for participant on nutrition & aerobic/resistive exercise along with prescribed medications to achieve LDL 70mg , HDL >40mg .   Expected Outcomes Long  Term: Cholesterol controlled with medications as prescribed, with individualized exercise RX and with personalized nutrition plan. Value goals: LDL < 70mg , HDL > 40 mg.;Short Term: Participant states understanding of desired cholesterol values and is compliant with medications prescribed. Participant is following exercise prescription and nutrition guidelines.   Stress Yes   Intervention Offer individual and/or small group education and counseling on adjustment to heart disease, stress management and health-related lifestyle change. Teach and support self-help strategies.;Refer participants experiencing significant psychosocial distress to appropriate mental health specialists for further evaluation and treatment. When possible, include family members and significant others in education/counseling sessions.   Expected Outcomes Short Term: Participant demonstrates changes in health-related behavior, relaxation and other stress management skills, ability to obtain effective social support, and compliance with psychotropic medications if prescribed.;Long Term: Emotional wellbeing is indicated by absence of clinically significant psychosocial distress or social isolation.       Personal Goals Discharge:   Nutrition & Weight - Outcomes:      Post Biometrics - 08/13/17 1654       Post  Biometrics   Height 5' 9.7" (1.77 m)   Weight 197 lb 1.6 oz (89.4 kg)   Waist Circumference 39 inches   Hip Circumference 41 inches   Waist to Hip Ratio 0.95 %   BMI (Calculated) 28.6      Nutrition:     Nutrition Therapy & Goals - 08/06/17 1800      Nutrition Therapy   Diet TLC    Protein (specify units) 11oz   Whole Grain Foods 3 servings   Saturated Fats 14 max. grams   Fruits and Vegetables 8 servings/day   Sodium 1500 grams     Personal Nutrition Goals   Nutrition Goal Continue with weight loss efforts by following 1800 calorie meal plan   Personal Goal #2 Keep working on portion control of foods, expecially starches and cheese. Eat plenty of vegetables and fruits.    Personal Goal #3 check food labels for sodium and keep trying more low sodium choices.    Comments Mr. Bowens has lost about 10-15lbs since working on diet and lifestyle changes. He reports he has significantly reduced his intake of pasta and breads, as well as beer. Weight loss has hit a plateau for now, but he will continue to work on food portions and low fat, low sodium choices.      Intervention Plan   Intervention Nutrition handout(s) given to patient.   Expected Outcomes Short Term Goal: A plan has been developed with personal nutrition goals set during dietitian appointment.;Long Term Goal: Adherence to prescribed nutrition plan.      Nutrition Discharge:     Nutrition Assessments - 08/17/17 1629      MEDFICTS Scores   Post Score 25      Education Questionnaire Score:     Knowledge Questionnaire Score - 08/17/17 1630      Knowledge Questionnaire Score   Post Score 26/28      Goals reviewed with patient; copy given to patient.

## 2017-09-15 DIAGNOSIS — R001 Bradycardia, unspecified: Secondary | ICD-10-CM | POA: Diagnosis not present

## 2017-09-15 DIAGNOSIS — I10 Essential (primary) hypertension: Secondary | ICD-10-CM | POA: Diagnosis not present

## 2017-09-15 DIAGNOSIS — I2510 Atherosclerotic heart disease of native coronary artery without angina pectoris: Secondary | ICD-10-CM | POA: Diagnosis not present

## 2017-10-29 DIAGNOSIS — I10 Essential (primary) hypertension: Secondary | ICD-10-CM | POA: Diagnosis not present

## 2017-10-29 DIAGNOSIS — I2510 Atherosclerotic heart disease of native coronary artery without angina pectoris: Secondary | ICD-10-CM | POA: Diagnosis not present

## 2017-10-29 DIAGNOSIS — Z79899 Other long term (current) drug therapy: Secondary | ICD-10-CM | POA: Diagnosis not present

## 2017-10-29 DIAGNOSIS — E782 Mixed hyperlipidemia: Secondary | ICD-10-CM | POA: Diagnosis not present

## 2017-10-29 DIAGNOSIS — Z23 Encounter for immunization: Secondary | ICD-10-CM | POA: Diagnosis not present

## 2018-01-05 DIAGNOSIS — E782 Mixed hyperlipidemia: Secondary | ICD-10-CM | POA: Diagnosis not present

## 2018-01-05 DIAGNOSIS — I10 Essential (primary) hypertension: Secondary | ICD-10-CM | POA: Diagnosis not present

## 2018-01-05 DIAGNOSIS — I2510 Atherosclerotic heart disease of native coronary artery without angina pectoris: Secondary | ICD-10-CM | POA: Diagnosis not present

## 2018-04-28 DIAGNOSIS — M25562 Pain in left knee: Secondary | ICD-10-CM | POA: Diagnosis not present

## 2018-04-28 DIAGNOSIS — Z0000 Encounter for general adult medical examination without abnormal findings: Secondary | ICD-10-CM | POA: Diagnosis not present

## 2018-04-28 DIAGNOSIS — M1712 Unilateral primary osteoarthritis, left knee: Secondary | ICD-10-CM | POA: Diagnosis not present

## 2018-04-28 DIAGNOSIS — E782 Mixed hyperlipidemia: Secondary | ICD-10-CM | POA: Diagnosis not present

## 2018-04-28 DIAGNOSIS — I10 Essential (primary) hypertension: Secondary | ICD-10-CM | POA: Diagnosis not present

## 2018-05-18 DIAGNOSIS — I10 Essential (primary) hypertension: Secondary | ICD-10-CM | POA: Diagnosis not present

## 2018-05-18 DIAGNOSIS — R001 Bradycardia, unspecified: Secondary | ICD-10-CM | POA: Diagnosis not present

## 2018-05-18 DIAGNOSIS — I2510 Atherosclerotic heart disease of native coronary artery without angina pectoris: Secondary | ICD-10-CM | POA: Diagnosis not present

## 2018-06-01 DIAGNOSIS — Z1211 Encounter for screening for malignant neoplasm of colon: Secondary | ICD-10-CM | POA: Diagnosis not present

## 2018-11-02 DIAGNOSIS — E782 Mixed hyperlipidemia: Secondary | ICD-10-CM | POA: Diagnosis not present

## 2018-11-02 DIAGNOSIS — Z79899 Other long term (current) drug therapy: Secondary | ICD-10-CM | POA: Diagnosis not present

## 2018-11-02 DIAGNOSIS — I10 Essential (primary) hypertension: Secondary | ICD-10-CM | POA: Diagnosis not present

## 2018-11-02 DIAGNOSIS — Z23 Encounter for immunization: Secondary | ICD-10-CM | POA: Diagnosis not present

## 2018-11-24 DIAGNOSIS — I2129 ST elevation (STEMI) myocardial infarction involving other sites: Secondary | ICD-10-CM | POA: Diagnosis not present

## 2018-11-24 DIAGNOSIS — I10 Essential (primary) hypertension: Secondary | ICD-10-CM | POA: Diagnosis not present

## 2018-11-24 DIAGNOSIS — I2510 Atherosclerotic heart disease of native coronary artery without angina pectoris: Secondary | ICD-10-CM | POA: Diagnosis not present

## 2019-05-10 DIAGNOSIS — Z0000 Encounter for general adult medical examination without abnormal findings: Secondary | ICD-10-CM | POA: Diagnosis not present

## 2019-05-10 DIAGNOSIS — I2510 Atherosclerotic heart disease of native coronary artery without angina pectoris: Secondary | ICD-10-CM | POA: Diagnosis not present

## 2019-05-10 DIAGNOSIS — I10 Essential (primary) hypertension: Secondary | ICD-10-CM | POA: Diagnosis not present

## 2019-05-10 DIAGNOSIS — Z79899 Other long term (current) drug therapy: Secondary | ICD-10-CM | POA: Diagnosis not present

## 2020-08-27 ENCOUNTER — Other Ambulatory Visit: Payer: Self-pay | Admitting: Internal Medicine

## 2020-08-27 DIAGNOSIS — R634 Abnormal weight loss: Secondary | ICD-10-CM

## 2020-08-27 DIAGNOSIS — R7989 Other specified abnormal findings of blood chemistry: Secondary | ICD-10-CM

## 2020-09-05 ENCOUNTER — Other Ambulatory Visit: Payer: Self-pay

## 2020-09-05 ENCOUNTER — Ambulatory Visit: Payer: 59 | Admitting: Urology

## 2020-09-05 ENCOUNTER — Encounter: Payer: Self-pay | Admitting: Urology

## 2020-09-05 VITALS — BP 136/81 | HR 71 | Ht 70.0 in | Wt 155.0 lb

## 2020-09-05 DIAGNOSIS — N400 Enlarged prostate without lower urinary tract symptoms: Secondary | ICD-10-CM | POA: Insufficient documentation

## 2020-09-05 DIAGNOSIS — R972 Elevated prostate specific antigen [PSA]: Secondary | ICD-10-CM

## 2020-09-05 HISTORY — DX: Elevated prostate specific antigen (PSA): R97.20

## 2020-09-05 HISTORY — DX: Benign prostatic hyperplasia without lower urinary tract symptoms: N40.0

## 2020-09-05 NOTE — Progress Notes (Signed)
09/05/2020 8:40 AM   Adam Cline 29-May-1961 [id number]  Referring provider: Idelle Crouch, MD [facility] [place],  [place] [address]  Chief Complaint  Patient presents with   Elevated PSA    HPI:  Adam Cline is a 59 y.o. male seen request of Dr. Doy Hutching for an elevated PSA.   PSA drawn 08/27/2020 elevated 5.49  PSA has slowly risen since 2018  Denies bothersome LUTS  Denies dysuria, gross hematuria  No flank, abdominal or pelvic pain  No family history prostate cancer  Denies previous urologic problems  Urinalysis at the time of PSA was normal      PMH: History reviewed. No pertinent past medical history.  Surgical History: History reviewed. No pertinent surgical history.  Home Medications:  Allergies as of 09/05/2020   No Known Allergies     Medication List       Accurate as of September 05, 2020  8:40 AM. If you have any questions, ask your nurse or doctor.        aspirin EC 81 MG tablet 1 tablet once daily.   atorvastatin 80 MG tablet Commonly known as: LIPITOR Take by mouth.   Brilinta 90 MG Tabs tablet Generic drug: ticagrelor Take by mouth.   fluticasone 50 MCG/ACT nasal spray Commonly known as: FLONASE Place into the nose.   lisinopril 10 MG tablet Commonly known as: ZESTRIL Take by mouth.   metoprolol tartrate 25 MG tablet Commonly known as: LOPRESSOR Take by mouth.   nitroGLYCERIN 0.4 MG SL tablet Commonly known as: NITROSTAT as needed.       Allergies: No Known Allergies  Family History: History reviewed. No pertinent family history.  Social History:  reports that he has never smoked. He does not have any smokeless tobacco history on file. No history on file for alcohol use and drug use.   Physical Exam: There were no vitals taken for this visit.  Constitutional:  Alert and oriented, No acute distress. HEENT: [place] AT, moist mucus membranes.  Trachea midline, no  masses. Cardiovascular: No clubbing, cyanosis, or edema. Respiratory: Normal respiratory effort, no increased work of breathing. GI: Abdomen is soft, nontender, nondistended, no abdominal masses GU: Phallus without lesions, testes descended bilateral without masses or tenderness, spermatic cord/epididymis palpably normal bilaterally.  Prostate 40 g, boggy without nodules, nontender Skin: No rashes, bruises or suspicious lesions. Neurologic: Grossly intact, no focal deficits, moving all 4 extremities. Psychiatric: Normal mood and affect.   Assessment & Plan:    1.  Elevated PSA  Although PSA is a prostate cancer screening test he was informed that cancer is not the most common cause of an elevated PSA. Other potential causes including BPH and inflammation were discussed. He was informed that the only way to adequately diagnose prostate cancer would be a transrectal ultrasound and biopsy of the prostate. The procedure was discussed including potential risks of bleeding and infection/sepsis. He was also informed that a negative biopsy does not conclusively rule out the possibility that prostate cancer may be present and that continued monitoring is required. The use of newer adjunctive blood tests including PHI and 4kScore were discussed. The use of multiparametric prostate MRI was also discussed however is not typically used for initial evaluation of an elevated PSA. Continued periodic surveillance was also discussed.  Based on his rising PSA I recommended scheduling TRUS/biopsy and he is in agreement.   Abbie Sons, MD  [place] [address]  [place], [place] [address] [phone]

## 2020-09-05 NOTE — Patient Instructions (Signed)

## 2020-09-06 ENCOUNTER — Ambulatory Visit: Admission: RE | Admit: 2020-09-06 | Payer: 59 | Source: Ambulatory Visit

## 2020-09-07 ENCOUNTER — Other Ambulatory Visit: Payer: Self-pay | Admitting: Internal Medicine

## 2020-09-07 DIAGNOSIS — R7989 Other specified abnormal findings of blood chemistry: Secondary | ICD-10-CM

## 2020-09-07 DIAGNOSIS — R634 Abnormal weight loss: Secondary | ICD-10-CM

## 2020-09-18 ENCOUNTER — Other Ambulatory Visit: Payer: Self-pay | Admitting: Internal Medicine

## 2020-09-18 DIAGNOSIS — R7989 Other specified abnormal findings of blood chemistry: Secondary | ICD-10-CM

## 2020-09-18 DIAGNOSIS — E782 Mixed hyperlipidemia: Secondary | ICD-10-CM

## 2020-09-18 DIAGNOSIS — R634 Abnormal weight loss: Secondary | ICD-10-CM

## 2020-09-21 ENCOUNTER — Ambulatory Visit: Payer: 59

## 2020-09-26 ENCOUNTER — Other Ambulatory Visit: Payer: Self-pay

## 2020-09-26 ENCOUNTER — Ambulatory Visit
Admission: RE | Admit: 2020-09-26 | Discharge: 2020-09-26 | Disposition: A | Discharge status: Home / Self Care | Payer: 59 | Source: Ambulatory Visit | Attending: Internal Medicine | Admitting: Internal Medicine

## 2020-09-26 DIAGNOSIS — E782 Mixed hyperlipidemia: Secondary | ICD-10-CM | POA: Insufficient documentation

## 2020-09-26 DIAGNOSIS — R945 Abnormal results of liver function studies: Secondary | ICD-10-CM | POA: Insufficient documentation

## 2020-09-26 DIAGNOSIS — R634 Abnormal weight loss: Secondary | ICD-10-CM | POA: Insufficient documentation

## 2020-09-26 DIAGNOSIS — R7989 Other specified abnormal findings of blood chemistry: Secondary | ICD-10-CM

## 2020-09-26 MED ORDER — IOHEXOL 300 MG/ML  SOLN
100.0000 mL | Freq: Once | INTRAMUSCULAR | Status: AC | PRN
  Administered 2020-09-26: 100 mL via INTRAVENOUS

## 2020-10-01 ENCOUNTER — Encounter: Payer: Self-pay | Admitting: Urology

## 2020-10-07 NOTE — Progress Notes (Signed)
Prostate Biopsy Procedure   Informed consent was obtained after discussing risks/benefits of the procedure.  A time out was performed to ensure correct patient identity.  Pre-Procedure: - Last PSA Level: 5.49 on 08/27/2020 - Gentamicin given prophylactically - Levaquin 500 mg administered PO -Transrectal Ultrasound performed revealing a 35 gm prostate -No significant hypoechoic or median lobe noted  Procedure: - Prostate block performed using 10 cc 1% lidocaine and biopsies taken from sextant areas, a total of 12 under ultrasound guidance.  Post-Procedure: - Patient tolerated the procedure well - He was counseled to seek immediate medical attention if experiences any severe pain, significant bleeding, or fevers - Return in one week to discuss biopsy results   John Giovanni, MD

## 2020-10-08 ENCOUNTER — Encounter: Payer: Self-pay | Admitting: Urology

## 2020-10-08 ENCOUNTER — Ambulatory Visit: Payer: 59 | Admitting: Urology

## 2020-10-08 ENCOUNTER — Other Ambulatory Visit: Payer: Self-pay

## 2020-10-08 VITALS — BP 113/72 | HR 73 | Ht 70.0 in | Wt 155.0 lb

## 2020-10-08 DIAGNOSIS — R972 Elevated prostate specific antigen [PSA]: Secondary | ICD-10-CM

## 2020-10-08 MED ORDER — LEVOFLOXACIN 500 MG PO TABS
500.0000 mg | ORAL_TABLET | Freq: Once | ORAL | Status: AC
  Administered 2020-10-08: 500 mg via ORAL

## 2020-10-08 MED ORDER — GENTAMICIN SULFATE 40 MG/ML IJ SOLN
80.0000 mg | Freq: Once | INTRAMUSCULAR | Status: AC
  Administered 2020-10-08: 80 mg via INTRAMUSCULAR

## 2020-10-09 LAB — SURGICAL PATHOLOGY

## 2020-10-18 NOTE — Progress Notes (Signed)
10/19/2020 11:33 AM   [name] 1961-07-05 [id number]  Referring provider: Idelle Crouch, MD [place] [facility] [place],  [place] [address] Chief Complaint  Patient presents with  . Elevated PSA    HPI: Adam Cline is a 59 y.o. male who returns today for discussion regarding prostate biopsy results. Patient is accompanied by his wife, Nevin Bloodgood today.    Biopsy 10/08/2020, PSA 5.49  No post biopsy complaints; volume 35 g  Pathology 2/12 cores with Gleason score (4+3) at the [facility]. 1/12 core with Gleason score (4+5) at the [facility]. 1/12 core with Gleason score (3+4) at the [facility].        PMH: No past medical history on file.  Surgical History: No past surgical history on file.  Home Medications:  Allergies as of 10/19/2020   No Known Allergies     Medication List       Accurate as of October 19, 2020 11:33 AM. If you have any questions, ask your nurse or doctor.        aspirin EC 81 MG tablet 1 tablet once daily.   atorvastatin 80 MG tablet Commonly known as: LIPITOR Take by mouth.   fluticasone 50 MCG/ACT nasal spray Commonly known as: FLONASE Place into the nose.   lisinopril 10 MG tablet Commonly known as: ZESTRIL Take by mouth.       Allergies: No Known Allergies  Family History: No family history on file.  Social History:  reports that he has never smoked. He has never used smokeless tobacco. No history on file for alcohol use and drug use.   Physical Exam: BP 131/81   Pulse 76   Ht 5\' 10"  (1.778 m)   Wt 155 lb (70.3 kg)   BMI 22.24 kg/m   Constitutional:  Alert and oriented, No acute distress. HEENT: [place] AT, moist mucus membranes.  Trachea midline, no masses. Cardiovascular: No clubbing, cyanosis, or edema. Respiratory: Normal respiratory effort, no increased work of breathing. Skin: No rashes, bruises or suspicious lesions. Neurologic: Grossly intact, no focal deficits, moving all 4  extremities. Psychiatric: Normal mood and affect.   Assessment & Plan:    1. T1c high risk prostate cancer The patient was counseled about the natural history of prostate cancer and the standard treatment options that are available for prostate cancer. It was explained to him how his age and life expectancy, clinical stage, Gleason score, and PSA affect his prognosis, the decision to proceed with additional staging studies, as well as how that information influences recommended treatment strategies. We discussed the roles for active surveillance (not recommended), radiation therapy, surgical therapy as well as ablative therapy options for the treatment of prostate cancer as appropriate to his individual cancer situation. We discussed the risks and benefits of these options with regard to their impact on cancer control and also in terms of potential adverse events, complications, and impact on quality of life particularly related to urinary, bowel, and sexual function. The patient was encouraged to ask questions throughout the discussion today and all questions were answered to his stated satisfaction.  We were out of literature on treatment options however there is some in the [facility] office.  He lives just [place] and will drop by Monday to pick up the literature.  He and his wife wanted to think over these options and will call back regarding any questions, treatment decisions or desire for referral  I spent 30 total minutes on the day  of the encounter including pre-visit review of the medical record, face-to-face time with the patient, and post visit ordering of labs/imaging/tests.  [facility] [address], [place], [place] [address] [phone]  I, Selena Batten, am acting as a scribe for Dr. Nicki Reaper C. Alize Borrayo,  I have reviewed the above documentation for accuracy and completeness, and I agree with the above.    Abbie Sons, MD

## 2020-10-19 ENCOUNTER — Encounter: Payer: Self-pay | Admitting: Urology

## 2020-10-19 ENCOUNTER — Other Ambulatory Visit: Payer: Self-pay

## 2020-10-19 ENCOUNTER — Ambulatory Visit (INDEPENDENT_AMBULATORY_CARE_PROVIDER_SITE_OTHER): Payer: 59 | Admitting: Urology

## 2020-10-19 VITALS — BP 131/81 | HR 76 | Ht 70.0 in | Wt 155.0 lb

## 2020-10-19 DIAGNOSIS — C61 Malignant neoplasm of prostate: Secondary | ICD-10-CM | POA: Diagnosis not present

## 2020-11-02 ENCOUNTER — Encounter: Payer: Self-pay | Admitting: Urology

## 2020-11-06 ENCOUNTER — Other Ambulatory Visit: Payer: Self-pay | Admitting: Urology

## 2020-11-06 DIAGNOSIS — C61 Malignant neoplasm of prostate: Secondary | ICD-10-CM

## 2020-11-13 DIAGNOSIS — C61 Malignant neoplasm of prostate: Secondary | ICD-10-CM | POA: Insufficient documentation

## 2020-11-13 HISTORY — DX: Malignant neoplasm of prostate: C61

## 2020-11-27 ENCOUNTER — Encounter
Admission: RE | Admit: 2020-11-27 | Discharge: 2020-11-27 | Disposition: A | Discharge status: Home / Self Care | Payer: 59 | Source: Ambulatory Visit | Attending: Urology | Admitting: Urology

## 2020-11-27 ENCOUNTER — Other Ambulatory Visit: Payer: Self-pay

## 2020-11-27 DIAGNOSIS — C61 Malignant neoplasm of prostate: Secondary | ICD-10-CM | POA: Insufficient documentation

## 2020-11-27 MED ORDER — TECHNETIUM TC 99M MEDRONATE IV KIT
20.0000 | PACK | Freq: Once | INTRAVENOUS | Status: AC | PRN
  Administered 2020-11-27: 22.812 via INTRAVENOUS

## 2020-11-30 ENCOUNTER — Encounter: Payer: Self-pay | Admitting: Urology

## 2020-12-06 ENCOUNTER — Other Ambulatory Visit: Payer: Self-pay

## 2020-12-06 ENCOUNTER — Encounter: Payer: Self-pay | Admitting: Urology

## 2020-12-06 ENCOUNTER — Ambulatory Visit (INDEPENDENT_AMBULATORY_CARE_PROVIDER_SITE_OTHER): Payer: 59 | Admitting: Urology

## 2020-12-06 VITALS — BP 123/75 | HR 69 | Ht 70.0 in | Wt 155.0 lb

## 2020-12-06 DIAGNOSIS — C61 Malignant neoplasm of prostate: Secondary | ICD-10-CM | POA: Diagnosis not present

## 2020-12-06 NOTE — Addendum Note (Signed)
Addended by: Tommy Rainwater on: 12/06/2020 01:32 PM   Modules accepted: Orders

## 2020-12-06 NOTE — Progress Notes (Signed)
12/06/2020 1:23 PM   Adam Cline 1961/01/15 [id number]  Referring provider: Idelle Crouch, MD [facility] [place],  [place] [address]  Chief Complaint  Patient presents with  . Prostate Cancer    Discuss surgery    HPI: 59-year-old male who presents today to discuss possible surgical intervention for his newly diagnosed high risk prostate cancer.  He was diagnosed by my partner, Dr. Bernardo Heater with Gleason 4+5 prostate cancer in the setting of rising PSA.  Please see Dr. Dene Gentry notes for details.  He is only one focus of high risk for Gleason 4+5 cancer along with 3 additional cores of intermediate risk prostate cancer for total 4 of 12 cores positive.  TRUS volume 35 g.  Staging including CT scan from 08/2020 as well as bone scan negative for any evidence of metastatic disease.  He has no significant baseline urinary symptoms.  He has no erectile dysfunction.  No previous abdominal surgeries.  Past medical history as below, personal history of CAD otherwise fairly healthy.  He is accompanied today by his wife.  PMH: Past Medical History:  Diagnosis Date  . Benign essential HTN 12/31/2016  . Benign prostatic hyperplasia without lower urinary tract symptoms 09/05/2020  . Bradycardia 05/26/2017  . Coronary artery disease involving native coronary artery of native heart without angina pectoris 12/31/2016   Overview:  Sp PCI and stent of OM2 2017 with minimal infarction  . Elevated PSA 09/05/2020  . Heart attack ([place]) 07/20/2017  . Hyperlipidemia, mixed 12/31/2016  . Myocardial infarction of posterolateral wall ([place]) 12/31/2016   Overview:  Sp PCI and stent of OM2 2017  . Prostate cancer ([employer]) 11/13/2020    Surgical History: Past Surgical History:  Procedure Laterality Date  . KNEE SURGERY Right     Home Medications:  Allergies as of 12/06/2020   No Known Allergies     Medication List       Accurate as of December 06, 2020  1:23 PM. If you have  any questions, ask your nurse or doctor.        aspirin EC 81 MG tablet 1 tablet once daily.   atorvastatin 80 MG tablet Commonly known as: LIPITOR Take by mouth.   fluticasone 50 MCG/ACT nasal spray Commonly known as: FLONASE Place into the nose.   lisinopril 10 MG tablet Commonly known as: ZESTRIL Take by mouth.       Allergies: No Known Allergies  Family History: Family History  Problem Relation Age of Onset  . Kidney cancer Neg Hx   . Bladder Cancer Neg Hx   . Prostate cancer Neg Hx     Social History:  reports that he has never smoked. He has never used smokeless tobacco. No history on file for alcohol use and drug use.   Physical Exam: BP 123/75   Pulse 69   Ht 5\' 10"  (1.778 m)   Wt 155 lb (70.3 kg)   BMI 22.24 kg/m   Constitutional:  Alert and oriented, No acute distress. HEENT: [place] AT, moist mucus membranes.  Trachea midline, no masses. Cardiovascular: No clubbing, cyanosis, or edema. Respiratory: Normal respiratory effort, no increased work of breathing. Skin: No rashes, bruises or suspicious lesions. Neurologic: Grossly intact, no focal deficits, moving all 4 extremities. Psychiatric: Normal mood and affect.   Assessment & Plan:    1. Prostate cancer [facility]) Newly diagnosed high risk prostate cancer without evidence of radiographically evident metastatic disease  The patient was counseled about the  natural history of prostate cancer and the standard treatment options that are available for prostate cancer. It was explained to him how his age and life expectancy, clinical stage, Gleason score, and PSA affect his prognosis, the decision to proceed with additional staging studies, as well as how that information influences recommended treatment strategies. We discussed the roles for active surveillance, radiation therapy, surgical therapy, androgen deprivation, as well as ablative therapy options for the treatment of prostate cancer as appropriate to his  individual cancer situation. We discussed the risks and benefits of these options with regard to their impact on cancer control and also in terms of potential adverse events, complications, and impact on quality of life particularly related to urinary, bowel, and sexual function. The patient was encouraged to ask questions throughout the discussion today and all questions were answered to his stated satisfaction. In addition, the patient was provided with and/or directed to appropriate resources and literature for further education about prostate cancer treatment options.  We discussed surgical therapy for prostate cancer including the different available surgical approaches.  Specifically, we discussed robotic prostatectomy with pelvic lymph node dissection based on his restratification.  Would recommend nonnerve sparing on the side of his more significant disease but consideration of nerve sparing on the contralateral side.  We discussed, in detail, the risks and expectations of surgery with regard to cancer control, urinary control, and erectile dysfunction as well as expected post operative recovery processed. Additional risks of surgery including but not limited to bleeding, infection, hernia formation, nerve damage, fistula formation, bowel/rectal injury, potentially necessitating colostomy, damage to the urinary tract resulting in urinary leakage, urethral stricture, and cardiopulmonary risk such as myocardial infarction, stroke, death, thromboembolism etc. were explained.   Alternatives to surgery were also discussed.  He was offered a referral to radiation oncology.  He reports today that he has an appointment with the multidisciplinary clinic in January at [facility] and declined radiation oncology appointment here locally.  We did discuss the role of physical therapy and pelvic floor evaluation prior to surgery as this can help with expediting recovery.  He did accept this today.  He will let us know  if he like to proceed here or elsewhere with surgical intervention.  All questions were answered today.   - Ambulatory referral to Physical Therapy  Hollice Espy, MD  [facility] [address] [place], [place] [address] [phone]  I spent 40 total minutes on the day of the encounter including pre-visit review of the medical record, face-to-face time with the patient, and post visit ordering of labs/imaging/tests.

## 2020-12-07 LAB — MICROSCOPIC EXAMINATION: Bacteria, UA: NONE SEEN

## 2020-12-07 LAB — URINALYSIS, COMPLETE
Bilirubin, UA: NEGATIVE
Glucose, UA: NEGATIVE
Ketones, UA: NEGATIVE
Leukocytes,UA: NEGATIVE
Nitrite, UA: NEGATIVE
Protein,UA: NEGATIVE
RBC, UA: NEGATIVE
Specific Gravity, UA: 1.01 (ref 1.005–1.030)
Urobilinogen, Ur: 0.2 mg/dL (ref 0.2–1.0)
pH, UA: 7 (ref 5.0–7.5)

## 2020-12-29 HISTORY — PX: PROSTATECTOMY: SHX69

## 2021-08-22 ENCOUNTER — Other Ambulatory Visit: Payer: Self-pay | Admitting: Internal Medicine

## 2021-08-22 DIAGNOSIS — R7989 Other specified abnormal findings of blood chemistry: Secondary | ICD-10-CM

## 2021-08-22 DIAGNOSIS — R945 Abnormal results of liver function studies: Secondary | ICD-10-CM

## 2021-08-22 DIAGNOSIS — R634 Abnormal weight loss: Secondary | ICD-10-CM

## 2021-10-28 IMAGING — CT CT ABD-PELV W/ CM
1 of 3 series · 14 of 32 positions shown, 19 images · IV contrast (omnipaque)
Comparison: None.

CLINICAL DATA: Weight loss.  Hyperlipidemia.  Abnormal LFTs.

EXAM:
CT ABDOMEN AND PELVIS WITH CONTRAST
TECHNIQUE: Multidetector CT imaging of the abdomen and pelvis was performed
using the standard protocol following bolus administration of
intravenous contrast.
CONTRAST:  100mL OMNIPAQUE IOHEXOL 300 MG/ML  SOLN

[Series 2: axial st · axial · 0.70mm/px · z∈[-907,-492]mm · 14 of 95 slices shown, 19 images]
[im 6/95  soft-tissue]
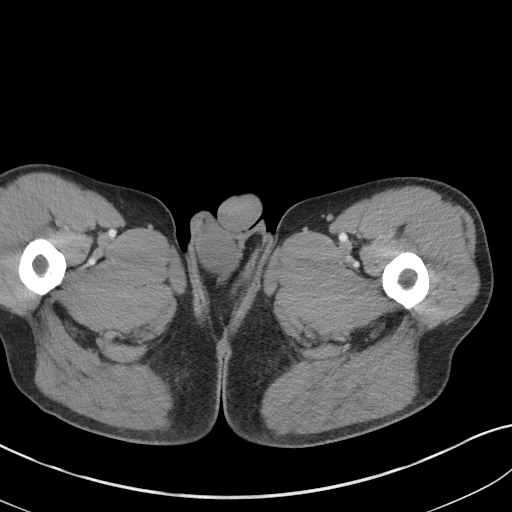
[im 6/95  bone]
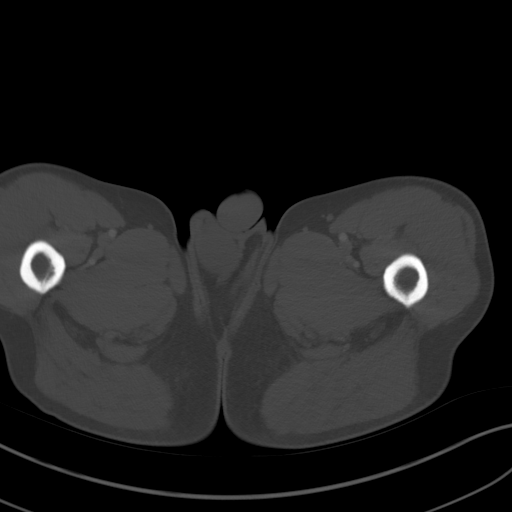
[im 11/95  soft-tissue]
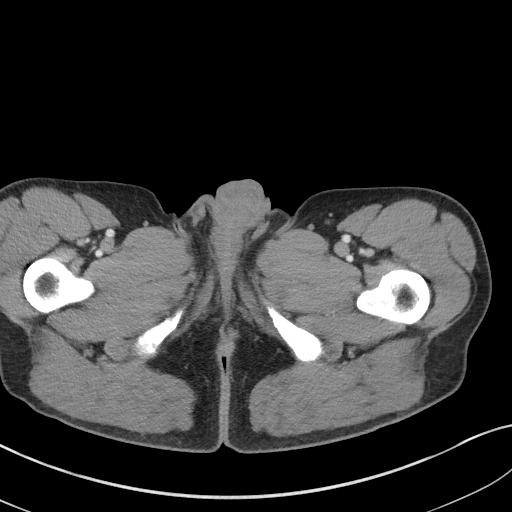
[im 21/95  soft-tissue]
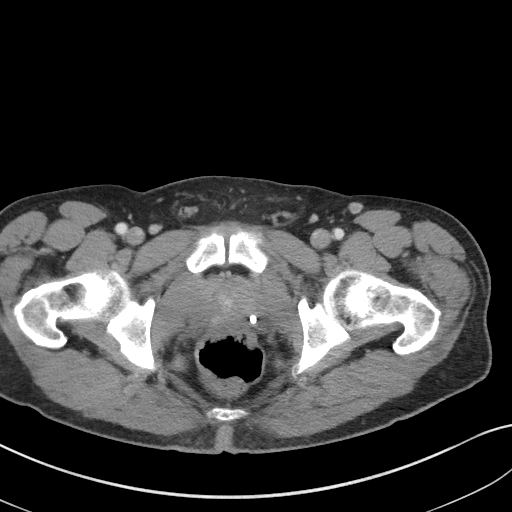
[im 27/95  soft-tissue]
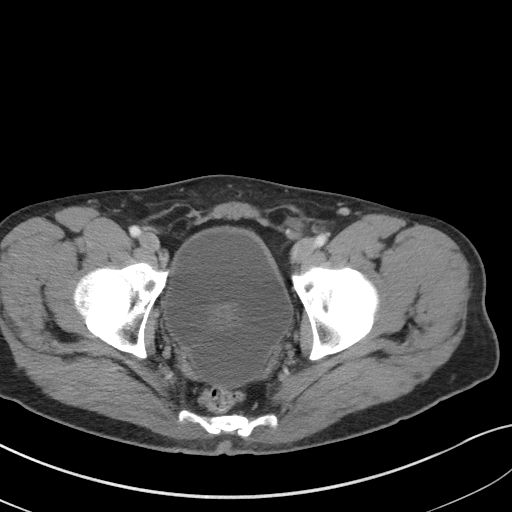
[im 32/95  soft-tissue]
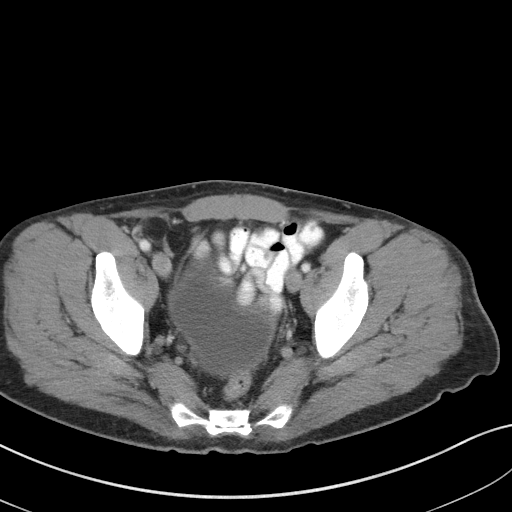
[im 42/95  soft-tissue]
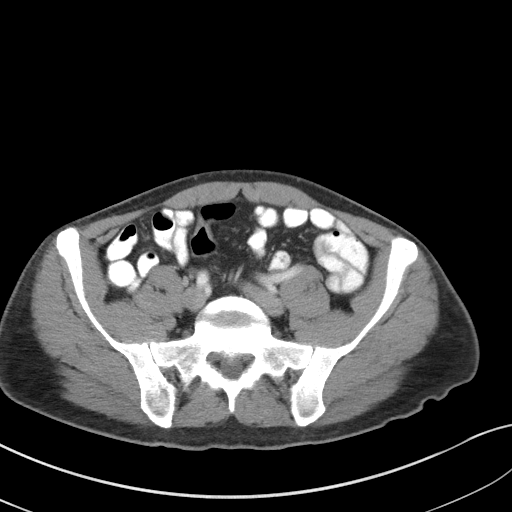
[im 48/95  soft-tissue]
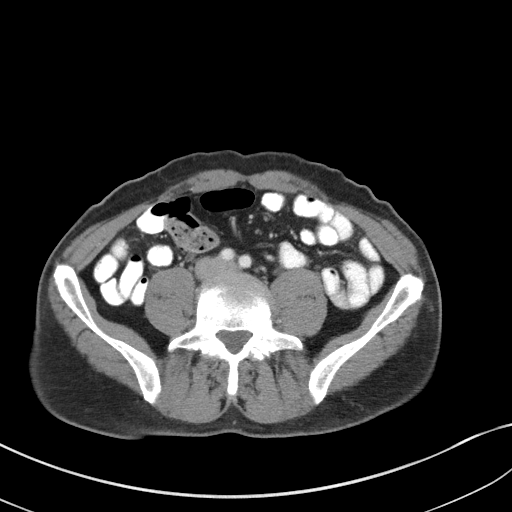
[im 53/95  soft-tissue]
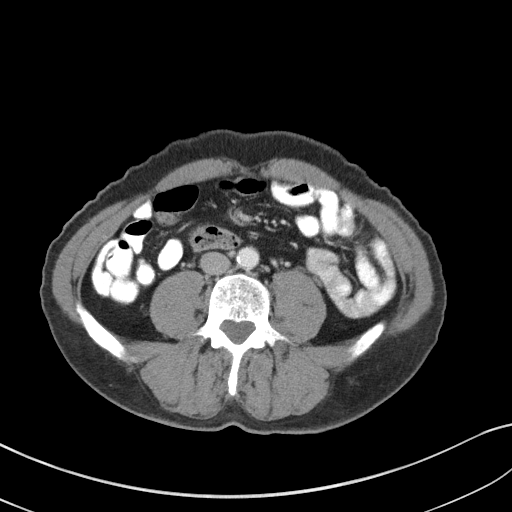
[im 63/95  soft-tissue]
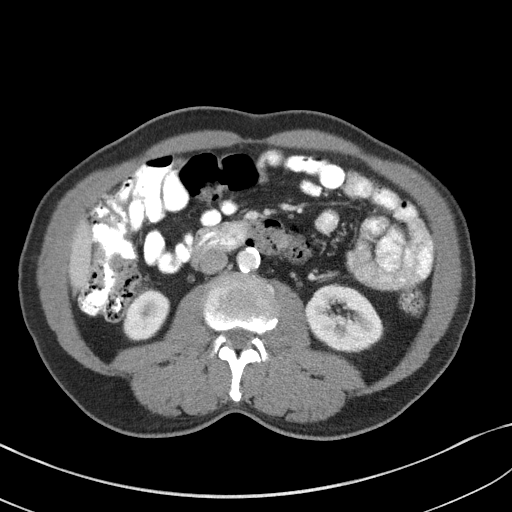
[im 63/95  bone]
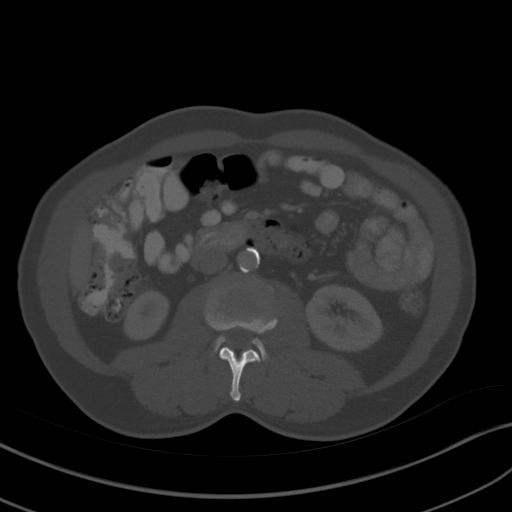
[im 68/95  soft-tissue]
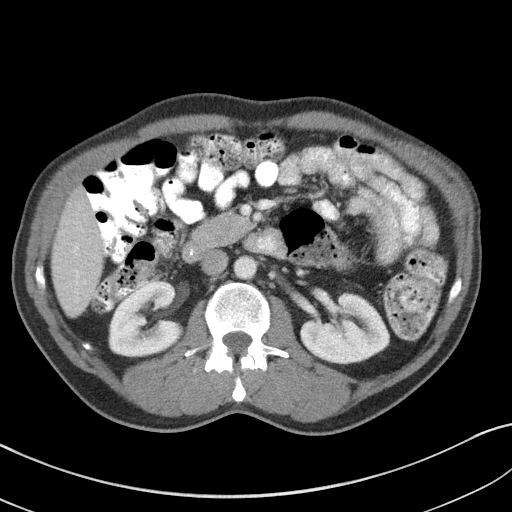
[im 74/95  soft-tissue]
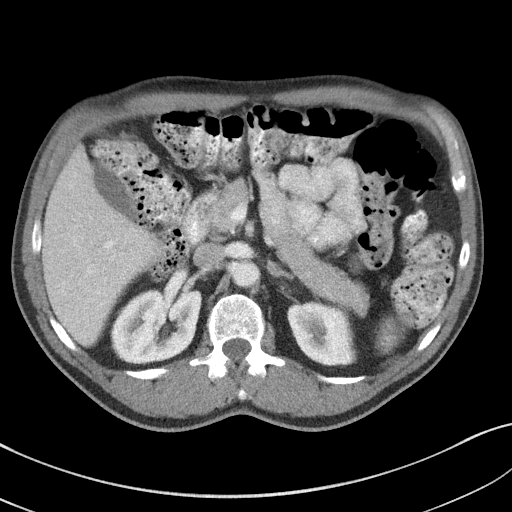
[im 74/95  lung]
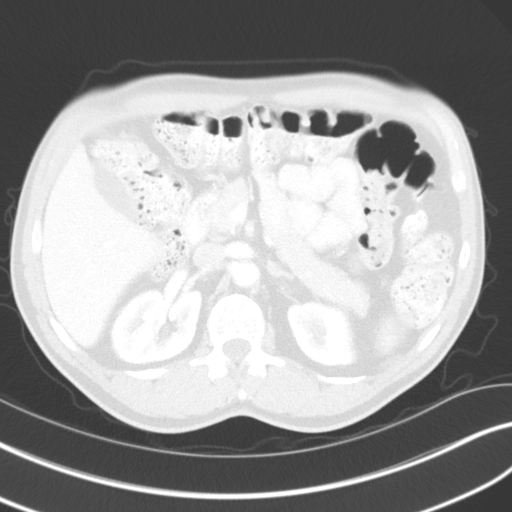
[im 79/95  lung]
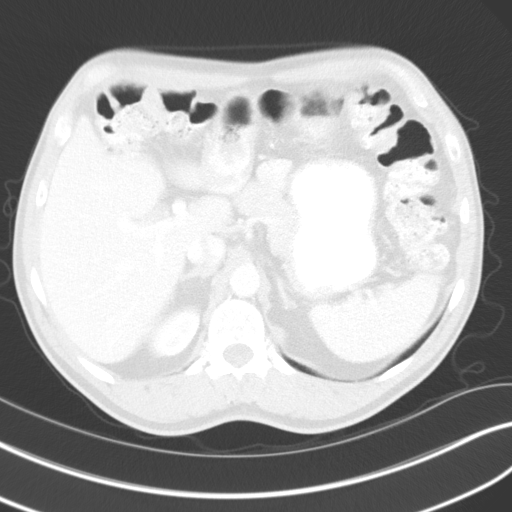
[im 84/95  soft-tissue]
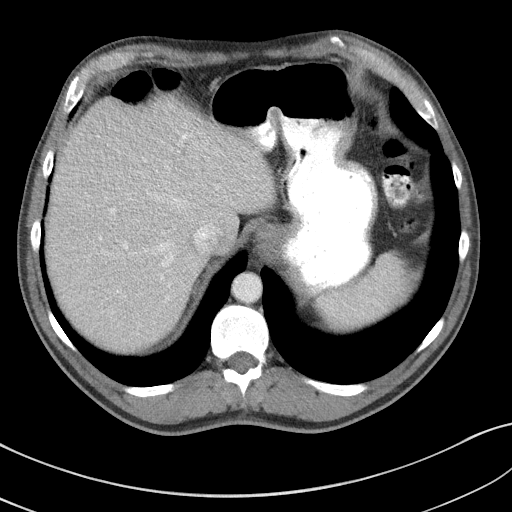
[im 84/95  lung]
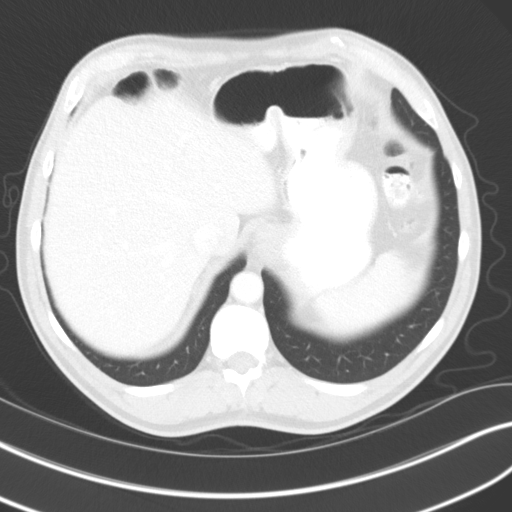
[im 89/95  soft-tissue]
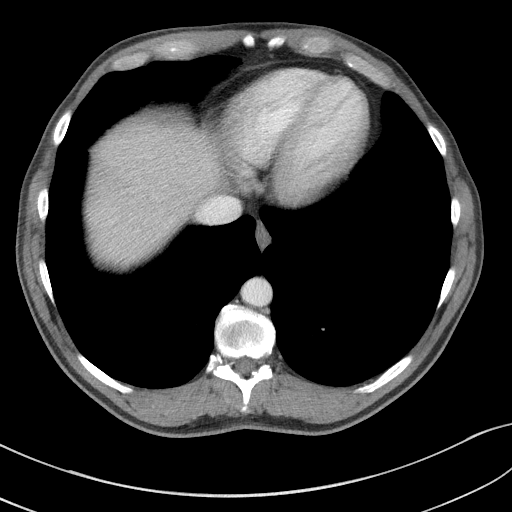
[im 89/95  lung]
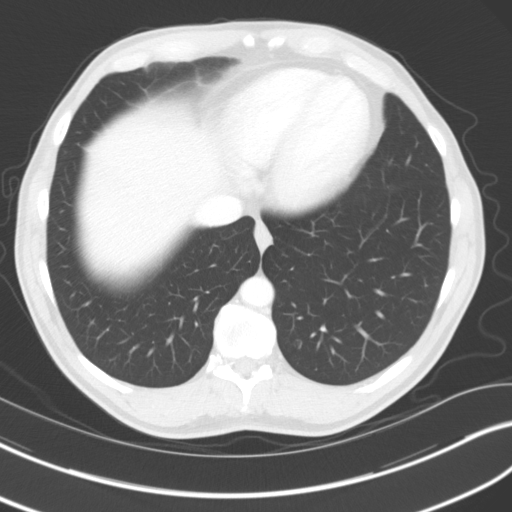

[14 of 32 positions shown; findings below may reference images not displayed]

FINDINGS: Lower chest: Lung bases are clear. No effusions. Heart is normal
size.

Hepatobiliary: Mild diffuse hepatic steatosis. No focal abnormality.
Gallbladder unremarkable.

Pancreas: No focal abnormality or ductal dilatation.

Spleen: No focal abnormality.  Normal size.

Adrenals/Urinary Tract: No adrenal abnormality. No focal renal
abnormality. No stones or hydronephrosis. Urinary bladder is
unremarkable.

Stomach/Bowel: Large stool burden throughout the colon. Stomach,
large and small bowel grossly unremarkable.

Vascular/Lymphatic: Aortic atherosclerosis. No evidence of aneurysm
or adenopathy.

Reproductive: Prostate enlargement.

Other: No free fluid or free air.

Musculoskeletal: No acute bony abnormality.
IMPRESSION: Suspect early diffuse fatty infiltration of the liver.

Large stool burden throughout the colon.

Mildly prominent prostate.

No acute findings.

## 2022-05-20 ENCOUNTER — Other Ambulatory Visit: Payer: Self-pay | Admitting: General Surgery

## 2022-05-20 NOTE — Progress Notes (Signed)
Subjective:     Patient ID: Adam Cline is a 61 y.o. male.   HPI   The following portions of the patient's history were reviewed and updated as appropriate.   This a new patient is here today for: office visit. Patient has been referred by Dr. Doy Hutching for evaluation of bilateral inguinal hernias. The patient reports they have been present for around a year and a half. He states the left side is worse that the right. The patient reports a 60 pound weight loss over the course of 4 years. Patient reports an episode one month ago when he had constipation and had to go lay down for hours due to the pain. Patient reports that he does have bowel movements once per day at present.    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  His job entails any lifting to 50 pounds.   The patient had an MI while visiting family outside [place], [place] in 2017 requiring stent placement in the past and this resulted in his marked change in diet and activity resulting in the above noted 60 pound weight loss.  When he had the most discomfort especially on the left side he will lay supine and gently massage the area and eventually reduce the hernia.   The patient underwent a robotic prostatectomy about 14 months ago.  He still has some "dribbling" and while the S3 nerves were reported preserved he has difficulty with marital relations.        Chief Complaint  Patient presents with   Inguinal Hernia      BP 106/56   Pulse 73   Temp 36.6 C (97.8 F)   Ht 174.6 cm (5' 8.74")   Wt 70.3 kg (155 lb)   SpO2 97%   BMI 23.06 kg/m        Past Medical History:  Diagnosis Date   Benign prostatic hyperplasia without lower urinary tract symptoms 09/05/2020   Bradycardia 05/26/2017   Coronary artery disease involving native coronary artery of native heart without angina pectoris 12/31/2016   Heart attack (CMS-HCC) 07/20/2017   History of cancer 10/08/2020   Hyperlipidemia 12/31/2016    mixed    Hypertension 12/31/2016   Myocardial infarction of posterolateral wall (CMS-HCC) 12/31/2016   Prostate cancer (CMS-HCC) 11/13/2020           Past Surgical History:  Procedure Laterality Date   LAPAROSCOPIC PROSTATECTOMY ROBOTIC N/A 03/12/2021    Procedure: ROBOT ASSISTED LAPAROSCOPY, SURGICAL; PROSTATECTOMY, RETROPUBIC RADICAL, INCLUDING NERVESPARING;  Surgeon: Zack Seal, MD;  Location: [facility];  Service: Urology;  Laterality: N/A;   LAPAROSCOPIC PELVIC LYMPH NODE SAMPLING Bilateral 03/12/2021    Procedure: LAPAROSCOPY, SURGICAL; WITH BILATERAL TOTAL PELVIC LYMPHADENECTOMY AND PERI-AORTIC LYMPH NODE SAMPLING (BIOPSY), SINGLE OR MULTIPLE;  Surgeon: Zack Seal, MD;  Location: [facility];  Service: Urology;  Laterality: Bilateral;   CARDIAC CATHETERIZATION       knee surgery  Right            Social History           Socioeconomic History   Marital status: Married  Tobacco Use   Smoking status: Never   Smokeless tobacco: Never  Vaping Use   Vaping Use: Never used  Substance and Sexual Activity   Alcohol use: Yes      Alcohol/week: 3.0 standard drinks      Types: 3 Glasses of wine per week   Drug use: No  Sexual activity: Defer        No Known Allergies   Current Medications        Current Outpatient Medications  Medication Sig Dispense Refill   ascorbic acid, vitamin C, (VITAMIN C) 1000 MG tablet Take 2,000 mg by mouth once daily       aspirin 81 MG EC tablet 1 tablet every morning      3   collagen/biotin/ascorbic acid (COLLAGEN 1500 PLUS C ORAL) Take by mouth       COQ10, UBIQUINOL, ORAL Take by mouth       cyanocobalamin (VITAMIN B12) 1000 MCG tablet Take 1,000 mcg by mouth once daily       diclofenac (VOLTAREN) 1 % topical gel APPLY 2G TOPICALLY 3 TIMES DAILY AS NEEDED 100 g 11   ezetimibe (ZETIA) 10 mg tablet Take 1 tablet (10 mg total) by mouth once daily 90 tablet 4   flaxseed oiL Oil Use       fluticasone propionate  (FLONASE NASAL) Place into one nostril       glucosam/chond-msm1/C/mang/bor (GLUCOSAMINE-CHOND-MSM COMPLEX ORAL) Take by mouth       lisinopriL (ZESTRIL) 10 MG tablet Take 1 tablet (10 mg total) by mouth once daily 90 tablet 4   psyllium, sugar, (METAMUCIL) 3.4 gram packet Take 1 packet by mouth 2 (two) times daily       sildenafiL (VIAGRA) 25 MG tablet Take 1 tablet (25 mg total) by mouth once daily as needed for Erectile Dysfunction for up to 30 days Take one tab three times per week as penile rehab. 30 tablet 11   tumeric-ging-olive-oreg-capryl 100 mg-150 mg- 50 mg-150 mg Cap Take 1 tablet by mouth once daily       VITAMIN D3 ORAL Take 50 mcg by mouth        No current facility-administered medications for this visit.             Family History  Problem Relation Age of Onset   Coronary Artery Disease (Blocked arteries around heart) Father     Coronary Artery Disease (Blocked arteries around heart) Paternal Aunt     Coronary Artery Disease (Blocked arteries around heart) Paternal Grandmother     Coronary Artery Disease (Blocked arteries around heart) Paternal Aunt     Coronary Artery Disease (Blocked arteries around heart) Paternal Uncle     Anesthesia problems Neg Hx          Labs and Radiology:    March 12, 2021 prostate pathology:   Pathologic stage [id number]        Review of Systems  Constitutional: Negative for chills and fever.  Respiratory: Negative for cough.          Objective:   Physical Exam Constitutional:      Appearance: Normal appearance.  Cardiovascular:     Rate and Rhythm: Normal rate and regular rhythm.     Pulses: Normal pulses.     Heart sounds: Normal heart sounds.  Pulmonary:     Effort: Pulmonary effort is normal.     Breath sounds: Normal breath sounds.  Abdominal:    Genitourinary:      Comments: Left greater than right inguinal hernias.  The left required a little more manipulation to fully reduce.  Testes are normal  bilaterally. Musculoskeletal:     Cervical back: Neck supple.  Neurological:     Mental Status: He is alert and oriented to person, place, and time.  Psychiatric:  Mood and Affect: Mood normal.        Behavior: Behavior normal.           Assessment:     Bilateral inguinal hernia, left greater than right.  Reducible.   Desire for robotic surgery if possible.    Plan:     The patient is a candidate for hernia repair based on their significant size and his reported difficulty with reduction especially on the left.  The hernia on the left is likely "sliding".   The role of prosthetic mesh and hernia repair was reviewed.  Whether his prior robotic prostatectomy would make this more difficult will need to be reviewed by one of the surgeons who does robotic hernia repair.  I will send this note to both Dr. Derek Mound and Dr. Tildon Husky for their review.  As a backstop the patient has an appointment with Dr. Tildon Husky on May 13, 2022.    This note is partially prepared by Ledell Noss, CMA acting as a scribe in the presence of Dr. Hervey Ard, MD.    The documentation recorded by the scribe accurately reflects the service I personally performed and the decisions made by me.    Robert Bellow, MD FACS     May 07, 2022:   The case was reviewed by both Dr. Lysle Pearl and [place].  Both feel open repair would be preferable in their hands in light of his previous robotic prostatectomy.   The patient will be contacted to determine if he would like referral to [facility] for consideration of robotic bilateral inguinal hernia repair or whether he would be desirous of proceeding with open repair here.  May 20, 2022:  Patient has decided to proceed with open repair.

## 2022-06-16 ENCOUNTER — Encounter
Admission: RE | Admit: 2022-06-16 | Discharge: 2022-06-16 | Disposition: A | Discharge status: Home / Self Care | Payer: 59 | Source: Ambulatory Visit | Attending: General Surgery | Admitting: General Surgery

## 2022-06-16 VITALS — Ht 69.0 in | Wt 153.0 lb

## 2022-06-16 DIAGNOSIS — I2129 ST elevation (STEMI) myocardial infarction involving other sites: Secondary | ICD-10-CM

## 2022-06-16 DIAGNOSIS — Z01818 Encounter for other preprocedural examination: Secondary | ICD-10-CM

## 2022-06-16 DIAGNOSIS — I10 Essential (primary) hypertension: Secondary | ICD-10-CM

## 2022-06-16 DIAGNOSIS — I2510 Atherosclerotic heart disease of native coronary artery without angina pectoris: Secondary | ICD-10-CM

## 2022-06-16 HISTORY — DX: Unspecified osteoarthritis, unspecified site: M19.90

## 2022-06-16 NOTE — Patient Instructions (Signed)
Your procedure is scheduled on:06-25-22 Wednesday Report to the Registration Desk on the 1st floor of the [facility].Then proceed to the 2nd floor Surgery Desk To find out your arrival time, please call [phone] between 1PM - 3PM on:06-24-22 Tuesday If your arrival time is 6:00 am, do not arrive prior to that time as the [facility] entrance doors do not open until 6:00 am.  REMEMBER: Instructions that are not followed completely may result in serious medical risk, up to and including death; or upon the discretion of your surgeon and anesthesiologist your surgery may need to be rescheduled.  Do not eat food after midnight the night before surgery.  No gum chewing, lozengers or hard candies.  You may however, drink CLEAR liquids up to 2 hours before you are scheduled to arrive for your surgery. Do not drink anything within 2 hours of your scheduled arrival time.  Clear liquids include: - water  - apple juice without pulp - gatorade (not RED colors) - black coffee or tea (Do NOT add milk or creamers to the coffee or tea) Do NOT drink anything that is not on this list.  TAKE THESE MEDICATIONS THE MORNING OF SURGERY WITH A SIP OF WATER: -ezetimibe (ZETIA)  Continue your 81 mg Aspirin up until the day prior to surgery-Do NOT take the morning of surgery  One week prior to surgery: Stop Anti-inflammatories (NSAIDS) such as Advil, Aleve, Ibuprofen, Motrin, Naproxen, Naprosyn and Aspirin based products such as Excedrin, Goodys Powder, BC Powder.You may however, take Tylenol if needed for pain up until the day of surgery.  Stop ANY OVER THE COUNTER supplements/vitamins 7 days prior to surgery-Last dose on 06-17-22 Tuesday  No Alcohol for 24 hours before or after surgery.  No Smoking including e-cigarettes for 24 hours prior to surgery.  No chewable tobacco products for at least 6 hours prior to surgery.  No nicotine patches on the day of surgery.  Do not use any "recreational" drugs  for at least a week prior to your surgery.  Please be advised that the combination of cocaine and anesthesia may have negative outcomes, up to and including death. If you test positive for cocaine, your surgery will be cancelled.  On the morning of surgery brush your teeth with toothpaste and water, you may rinse your mouth with mouthwash if you wish. Do not swallow any toothpaste or mouthwash.  Use CHG Soap as directed on instruction sheet.  Do not wear jewelry, make-up, hairpins, clips or nail polish.  Do not wear lotions, powders, or perfumes.   Do not shave body from the neck down 48 hours prior to surgery just in case you cut yourself which could leave a site for infection.  Also, freshly shaved skin may become irritated if using the CHG soap.  Contact lenses, hearing aids and dentures may not be worn into surgery.  Do not bring valuables to the hospital. [facility] is not responsible for any missing/lost belongings or valuables.   Notify your doctor if there is any change in your medical condition (cold, fever, infection).  Wear comfortable clothing (specific to your surgery type) to the hospital.  After surgery, you can help prevent lung complications by doing breathing exercises.  Take deep breaths and cough every 1-2 hours. Your doctor may order a device called an Incentive Spirometer to help you take deep breaths. When coughing or sneezing, hold a pillow firmly against your incision with both hands. This is called "splinting." Doing this helps protect  your incision. It also decreases belly discomfort.  If you are being admitted to the hospital overnight, leave your suitcase in the car. After surgery it may be brought to your room.  If you are being discharged the day of surgery, you will not be allowed to drive home. You will need a responsible adult (18 years or older) to drive you home and stay with you that night.   If you are taking public transportation, you will  need to have a responsible adult (18 years or older) with you. Please confirm with your physician that it is acceptable to use public transportation.   Please call the [facility] at [phone] if you have any questions about these instructions.  Surgery Visitation Policy:  Patients undergoing a surgery or procedure may have two family members or support persons with them as long as the person is not COVID-19 positive or experiencing its symptoms.

## 2022-06-18 ENCOUNTER — Encounter
Admission: RE | Admit: 2022-06-18 | Discharge: 2022-06-18 | Disposition: A | Discharge status: Home / Self Care | Payer: 59 | Source: Ambulatory Visit | Attending: General Surgery | Admitting: General Surgery

## 2022-06-18 DIAGNOSIS — I2510 Atherosclerotic heart disease of native coronary artery without angina pectoris: Secondary | ICD-10-CM | POA: Insufficient documentation

## 2022-06-18 DIAGNOSIS — I2129 ST elevation (STEMI) myocardial infarction involving other sites: Secondary | ICD-10-CM | POA: Insufficient documentation

## 2022-06-18 DIAGNOSIS — I10 Essential (primary) hypertension: Secondary | ICD-10-CM | POA: Insufficient documentation

## 2022-06-18 DIAGNOSIS — Z01818 Encounter for other preprocedural examination: Secondary | ICD-10-CM

## 2022-06-18 DIAGNOSIS — Z01810 Encounter for preprocedural cardiovascular examination: Secondary | ICD-10-CM | POA: Insufficient documentation

## 2022-06-20 ENCOUNTER — Encounter: Payer: Self-pay | Admitting: General Surgery

## 2022-06-25 ENCOUNTER — Ambulatory Visit
Admission: RE | Admit: 2022-06-25 | Discharge: 2022-06-25 | Disposition: A | Discharge status: Home / Self Care | Payer: 59 | Attending: General Surgery | Admitting: General Surgery

## 2022-06-25 ENCOUNTER — Ambulatory Visit: Payer: 59 | Admitting: Urgent Care

## 2022-06-25 ENCOUNTER — Encounter
Admission: RE | Disposition: A | Discharge status: Home / Self Care | Payer: Self-pay | Source: Home / Self Care | Attending: General Surgery

## 2022-06-25 ENCOUNTER — Other Ambulatory Visit: Payer: Self-pay

## 2022-06-25 ENCOUNTER — Encounter: Payer: Self-pay | Admitting: General Surgery

## 2022-06-25 DIAGNOSIS — Z955 Presence of coronary angioplasty implant and graft: Secondary | ICD-10-CM | POA: Insufficient documentation

## 2022-06-25 DIAGNOSIS — D176 Benign lipomatous neoplasm of spermatic cord: Secondary | ICD-10-CM | POA: Insufficient documentation

## 2022-06-25 DIAGNOSIS — I252 Old myocardial infarction: Secondary | ICD-10-CM | POA: Insufficient documentation

## 2022-06-25 DIAGNOSIS — I10 Essential (primary) hypertension: Secondary | ICD-10-CM | POA: Insufficient documentation

## 2022-06-25 DIAGNOSIS — E782 Mixed hyperlipidemia: Secondary | ICD-10-CM | POA: Insufficient documentation

## 2022-06-25 DIAGNOSIS — K4020 Bilateral inguinal hernia, without obstruction or gangrene, not specified as recurrent: Secondary | ICD-10-CM | POA: Insufficient documentation

## 2022-06-25 DIAGNOSIS — I2510 Atherosclerotic heart disease of native coronary artery without angina pectoris: Secondary | ICD-10-CM | POA: Diagnosis not present

## 2022-06-25 DIAGNOSIS — Z8546 Personal history of malignant neoplasm of prostate: Secondary | ICD-10-CM | POA: Insufficient documentation

## 2022-06-25 DIAGNOSIS — Z9079 Acquired absence of other genital organ(s): Secondary | ICD-10-CM | POA: Insufficient documentation

## 2022-06-25 HISTORY — DX: Male erectile dysfunction, unspecified: N52.9

## 2022-06-25 HISTORY — DX: Bilateral inguinal hernia, without obstruction or gangrene, not specified as recurrent: K40.20

## 2022-06-25 HISTORY — PX: INGUINAL HERNIA REPAIR: SHX194

## 2022-06-25 SURGERY — REPAIR, HERNIA, INGUINAL, ADULT
Anesthesia: General | Site: Groin | Laterality: Bilateral

## 2022-06-25 MED ORDER — ORAL CARE MOUTH RINSE: 15.0000 mL | Freq: Once | OROMUCOSAL | Status: AC

## 2022-06-25 MED ORDER — EPHEDRINE SULFATE (PRESSORS) 50 MG/ML IJ SOLN
INTRAMUSCULAR | Status: DC | PRN
  Administered 2022-06-25 (×2): 10 mg via INTRAVENOUS

## 2022-06-25 MED ORDER — MIDAZOLAM HCL 2 MG/2ML IJ SOLN
INTRAMUSCULAR | Status: AC
  Filled 2022-06-25: qty 2

## 2022-06-25 MED ORDER — HYDROCODONE-ACETAMINOPHEN 5-325 MG PO TABS: 1.0000 | ORAL_TABLET | ORAL | 0 refills | Status: AC | PRN

## 2022-06-25 MED ORDER — CEFAZOLIN SODIUM-DEXTROSE 2-4 GM/100ML-% IV SOLN
2.0000 g | INTRAVENOUS | Status: AC
  Administered 2022-06-25: 2 g via INTRAVENOUS

## 2022-06-25 MED ORDER — DEXAMETHASONE SODIUM PHOSPHATE 10 MG/ML IJ SOLN
INTRAMUSCULAR | Status: DC | PRN
  Administered 2022-06-25: 8 mg via INTRAVENOUS

## 2022-06-25 MED ORDER — LACTATED RINGERS IV SOLN: INTRAVENOUS | Status: DC

## 2022-06-25 MED ORDER — ACETAMINOPHEN 10 MG/ML IV SOLN
INTRAVENOUS | Status: AC
  Filled 2022-06-25: qty 100

## 2022-06-25 MED ORDER — CHLORHEXIDINE GLUCONATE CLOTH 2 % EX PADS: 6.0000 | MEDICATED_PAD | Freq: Once | CUTANEOUS | Status: DC

## 2022-06-25 MED ORDER — BUPIVACAINE LIPOSOME 1.3 % IJ SUSP
INTRAMUSCULAR | Status: AC
  Filled 2022-06-25: qty 20

## 2022-06-25 MED ORDER — FAMOTIDINE 20 MG PO TABS
ORAL_TABLET | ORAL | Status: AC
  Administered 2022-06-25: 20 mg via ORAL
  Filled 2022-06-25: qty 1

## 2022-06-25 MED ORDER — CHLORHEXIDINE GLUCONATE 0.12 % MT SOLN
OROMUCOSAL | Status: AC
  Administered 2022-06-25: 15 mL via OROMUCOSAL
  Filled 2022-06-25: qty 15

## 2022-06-25 MED ORDER — KETOROLAC TROMETHAMINE 30 MG/ML IJ SOLN
INTRAMUSCULAR | Status: DC | PRN
  Administered 2022-06-25 (×2): 30 mg via INTRAVENOUS

## 2022-06-25 MED ORDER — MIDAZOLAM HCL 2 MG/2ML IJ SOLN
INTRAMUSCULAR | Status: DC | PRN
  Administered 2022-06-25: 2 mg via INTRAVENOUS

## 2022-06-25 MED ORDER — CHLORHEXIDINE GLUCONATE 0.12 % MT SOLN: 15.0000 mL | Freq: Once | OROMUCOSAL | Status: AC

## 2022-06-25 MED ORDER — BUPIVACAINE LIPOSOME 1.3 % IJ SUSP: INTRAMUSCULAR | Status: DC | PRN

## 2022-06-25 MED ORDER — BUPIVACAINE-EPINEPHRINE (PF) 0.5% -1:200000 IJ SOLN
INTRAMUSCULAR | Status: AC
Start: 2022-06-25 — End: ?
  Filled 2022-06-25: qty 30

## 2022-06-25 MED ORDER — LIDOCAINE HCL (CARDIAC) PF 100 MG/5ML IV SOSY
PREFILLED_SYRINGE | INTRAVENOUS | Status: DC | PRN
  Administered 2022-06-25: 60 mg via INTRAVENOUS

## 2022-06-25 MED ORDER — BUPIVACAINE LIPOSOME 1.3 % IJ SUSP
INTRAMUSCULAR | Status: DC | PRN
  Administered 2022-06-25 (×2): 25 mL via SURGICAL_CAVITY

## 2022-06-25 MED ORDER — 0.9 % SODIUM CHLORIDE (POUR BTL) OPTIME
TOPICAL | Status: DC | PRN
  Administered 2022-06-25: 500 mL

## 2022-06-25 MED ORDER — CEFAZOLIN SODIUM-DEXTROSE 2-4 GM/100ML-% IV SOLN
INTRAVENOUS | Status: AC
  Filled 2022-06-25: qty 100

## 2022-06-25 MED ORDER — FENTANYL CITRATE (PF) 100 MCG/2ML IJ SOLN
INTRAMUSCULAR | Status: AC
  Filled 2022-06-25: qty 2

## 2022-06-25 MED ORDER — OXYCODONE HCL 5 MG/5ML PO SOLN: 5.0000 mg | Freq: Once | ORAL | Status: DC | PRN

## 2022-06-25 MED ORDER — ACETAMINOPHEN 10 MG/ML IV SOLN
INTRAVENOUS | Status: DC | PRN
  Administered 2022-06-25: 1000 mg via INTRAVENOUS

## 2022-06-25 MED ORDER — OXYCODONE HCL 5 MG PO TABS: 5.0000 mg | ORAL_TABLET | Freq: Once | ORAL | Status: DC | PRN

## 2022-06-25 MED ORDER — FENTANYL CITRATE (PF) 100 MCG/2ML IJ SOLN: 25.0000 ug | INTRAMUSCULAR | Status: DC | PRN

## 2022-06-25 MED ORDER — BUPIVACAINE HCL (PF) 0.25 % IJ SOLN
INTRAMUSCULAR | Status: AC
Start: 2022-06-25 — End: ?
  Filled 2022-06-25: qty 30

## 2022-06-25 MED ORDER — ONDANSETRON HCL 4 MG/2ML IJ SOLN
INTRAMUSCULAR | Status: DC | PRN
  Administered 2022-06-25: 4 mg via INTRAVENOUS

## 2022-06-25 MED ORDER — LACTATED RINGERS IV SOLN: INTRAVENOUS | Status: DC | PRN

## 2022-06-25 MED ORDER — ONDANSETRON HCL 4 MG/2ML IJ SOLN: 4.0000 mg | Freq: Once | INTRAMUSCULAR | Status: DC | PRN

## 2022-06-25 MED ORDER — FAMOTIDINE 20 MG PO TABS: 20.0000 mg | ORAL_TABLET | Freq: Once | ORAL | Status: AC

## 2022-06-25 MED ORDER — PROPOFOL 10 MG/ML IV BOLUS
INTRAVENOUS | Status: DC | PRN
  Administered 2022-06-25: 200 mg via INTRAVENOUS

## 2022-06-25 MED ORDER — FENTANYL CITRATE (PF) 100 MCG/2ML IJ SOLN
INTRAMUSCULAR | Status: DC | PRN
  Administered 2022-06-25 (×2): 50 ug via INTRAVENOUS

## 2022-06-25 SURGICAL SUPPLY — 35 items
BLADE CLIPPER SURG (BLADE) ×1 IMPLANT
BLADE SURG 15 STRL SS SAFETY (BLADE) ×4 IMPLANT
CHLORAPREP W/TINT 26 (MISCELLANEOUS) ×3 IMPLANT
DRAIN PENROSE 12X.25 LTX STRL (MISCELLANEOUS) ×2 IMPLANT
DRAPE LAPAROTOMY 100X77 ABD (DRAPES) ×2 IMPLANT
DRSG TEGADERM 4X4.75 (GAUZE/BANDAGES/DRESSINGS) ×3 IMPLANT
DRSG TELFA 4X3 1S NADH ST (GAUZE/BANDAGES/DRESSINGS) ×2 IMPLANT
ELECT REM PT RETURN 9FT ADLT (ELECTROSURGICAL) ×2
ELECTRODE REM PT RTRN 9FT ADLT (ELECTROSURGICAL) ×1 IMPLANT
GAUZE 4X4 16PLY ~~LOC~~+RFID DBL (SPONGE) ×2 IMPLANT
GLOVE BIO SURGEON STRL SZ7.5 (GLOVE) ×2 IMPLANT
GLOVE SURG UNDER LTX SZ8 (GLOVE) ×2 IMPLANT
GOWN STRL REUS W/ TWL LRG LVL3 (GOWN DISPOSABLE) ×2 IMPLANT
GOWN STRL REUS W/TWL LRG LVL3 (GOWN DISPOSABLE) ×2
KIT TURNOVER KIT A (KITS) ×2 IMPLANT
LABEL OR SOLS (LABEL) ×2 IMPLANT
MANIFOLD NEPTUNE II (INSTRUMENTS) ×2 IMPLANT
MESH HERNIA PLUG LRG (Mesh General) IMPLANT
MESH HERNIA SYS ULTRAPRO LRG (Mesh General) IMPLANT
MESH HERNIA ULTRAPRO MED (Mesh General) IMPLANT
MESH MARLEX PLUG MEDIUM (Mesh General) ×2 IMPLANT
NEEDLE HYPO 22GX1.5 SAFETY (NEEDLE) ×4 IMPLANT
PACK BASIN MINOR ARMC (MISCELLANEOUS) ×2 IMPLANT
STRIP CLOSURE SKIN 1/2X4 (GAUZE/BANDAGES/DRESSINGS) ×3 IMPLANT
SUT PDS AB 0 CT1 27 (SUTURE) IMPLANT
SUT SURGILON 0 BLK (SUTURE) ×3 IMPLANT
SUT VIC AB 2-0 SH 27 (SUTURE) ×2
SUT VIC AB 2-0 SH 27XBRD (SUTURE) ×1 IMPLANT
SUT VIC AB 3-0 54X BRD REEL (SUTURE) ×1 IMPLANT
SUT VIC AB 3-0 BRD 54 (SUTURE) ×2
SUT VIC AB 3-0 SH 27 (SUTURE) ×2
SUT VIC AB 3-0 SH 27X BRD (SUTURE) ×1 IMPLANT
SUT VIC AB 4-0 FS2 27 (SUTURE) ×3 IMPLANT
SWABSTK COMLB BENZOIN TINCTURE (MISCELLANEOUS) ×3 IMPLANT
SYR 10ML LL (SYRINGE) ×2 IMPLANT

## 2022-06-25 NOTE — H&P (Signed)
Adam Cline [id number] [birth year]     HPI:  Healthy male with symptomatic bilateral inguinal hernias.  Prior robotic prostatectomy.  Candidate for open repair.   Medications Prior to Admission  Medication Sig Dispense Refill Last Dose   ezetimibe (ZETIA) 10 MG tablet Take 10 mg by mouth every morning.   06/25/2022 at 0630   fluticasone (FLONASE) 50 MCG/ACT nasal spray Place 2 sprays into both nostrils as needed.   06/24/2022   lisinopril (PRINIVIL,ZESTRIL) 10 MG tablet Take 10 mg by mouth at bedtime.   06/24/2022   Ascorbic Acid (VITAMIN C) 1000 MG tablet Take 2,000 mg by mouth daily.   06/18/2022   aspirin EC 81 MG tablet 1 tablet once daily.   06/23/2022   Cholecalciferol (VITAMIN D3 PO) Take 1 tablet by mouth daily at 6 (six) AM.   06/18/2022   Coenzyme Q10 (CO Q 10 PO) Take 1 tablet by mouth daily at 6 (six) AM.   06/18/2022   Collagen-Vitamin C (COLLAGEN PLUS VITAMIN C PO) Take 1 Scoop by mouth daily at 6 (six) AM.   06/18/2022   diclofenac Sodium (VOLTAREN) 1 % GEL Apply 4 g topically as needed.   PRN   Ferrous Sulfate (IRON PO) Take 1 tablet by mouth daily.   06/18/2022   Flaxseed, Linseed, (FLAXSEED OIL PO) Take 1 tablet by mouth daily at 6 (six) AM.   06/18/2022   GLUCOSAMINE-CHONDROITIN-MSM-D PO Take 1 tablet by mouth daily at 6 (six) AM.   06/18/2022   Psyllium (METAMUCIL PO) Take 1 Scoop by mouth daily at 6 (six) AM.   06/23/2022   Sildenafil Citrate (VIAGRA PO) Take 1 tablet by mouth as needed.   06/23/2022   TURMERIC CURCUMIN PO Take 1 tablet by mouth daily at 6 (six) AM.   06/18/2022   No Known Allergies Past Medical History:  Diagnosis Date   Arthritis    Benign essential HTN 12/31/2016   Benign prostatic hyperplasia without lower urinary tract symptoms 09/05/2020   Bilateral inguinal hernia    Bradycardia 05/26/2017   Coronary artery disease involving native coronary artery of native heart without angina pectoris    a.) lateral NSTEMI 12/22/2016 --> LHC 12/22/2016 --> PCI placing  a DES (unknown type) to OM1   Elevated PSA 09/05/2020   Erectile dysfunction    a.) on PDE5i (sildenafil)   Hyperlipidemia, mixed 12/31/2016   Lateral NSTEMI (non-ST elevated myocardial infarction) ([place]) 12/22/2016   a.) LHC 12/22/2016 at [facility] in [place] (Dr. Posey Pronto) --> PCI placing a DES (unknown type) to OM1   Prostate cancer ([place]) 11/13/2020   a.) [place] 4+3=7 prostate cancer; pT2N0MxR0 with PNI; s/p RALP and bilateral PLND 03/12/21   Past Surgical History:  Procedure Laterality Date   CORONARY ANGIOPLASTY WITH STENT PLACEMENT Left 12/22/2016   Procedure: CORONARY ANGIOPLASTY WITH STENT PLACEMENT; Location: [facility] in [place]; Surgeon: Posey Pronto, MD   LAPAROSCOPY W/ TOTAL BILATERAL PELVIC AND PERI-AORTIC LYMPHADENECTOMY N/A 03/02/2021   ROBOT ASSISTED LAPAROSCOPIC RADICAL PROSTATECTOMY N/A 03/02/2021   Social History   Socioeconomic History   Marital status: Married    Spouse name: Not on file   Number of children: Not on file   Years of education: Not on file   Highest education level: Not on file  Occupational History   Not on file  Tobacco Use   Smoking status: Never   Smokeless tobacco: Never  Vaping Use   Vaping Use: Not on file  Substance and Sexual Activity  Alcohol use: Yes    Comment: occ   Drug use: Never   Sexual activity: Not on file  Other Topics Concern   Not on file  Social History Narrative   Not on file   Social Determinants of Health   Financial Resource Strain: Not on file  Food Insecurity: Not on file  Transportation Needs: Not on file  Physical Activity: Not on file  Stress: Not on file  Social Connections: Not on file  Intimate Partner Violence: Not on file   Social History   Social History Narrative   Not on file     ROS: Negative.     PE: HEENT: Negative. Lungs: Clear. Cardio: RR.   Assessment/Plan:  Proceed with planned bilateral inguinal hernia repair.  Adam Cline [facility] 06/25/2022

## 2022-06-25 NOTE — Progress Notes (Signed)
Patient urinated 783m, no pain reported, reviewed discharge instructions with patient and wife - verbalized understanding.  RN informed Dr. BBary Castilla patient cleared to be discharged.

## 2022-06-25 NOTE — Anesthesia Procedure Notes (Signed)
Procedure Name: LMA Insertion Date/Time: 06/25/2022 11:26 AM  Performed by: Beverely Low, CRNAPre-anesthesia Checklist: Patient identified, Patient being monitored, Timeout performed, Emergency Drugs available and Suction available Patient Re-evaluated:Patient Re-evaluated prior to induction Oxygen Delivery Method: Circle system utilized Preoxygenation: Pre-oxygenation with 100% oxygen Induction Type: IV induction Ventilation: Mask ventilation without difficulty LMA: LMA inserted LMA Size: 4.5 Tube type: Oral Number of attempts: 1 Placement Confirmation: positive ETCO2 and breath sounds checked- equal and bilateral Tube secured with: Tape Dental Injury: Teeth and Oropharynx as per pre-operative assessment

## 2022-06-25 NOTE — Anesthesia Preprocedure Evaluation (Addendum)
Anesthesia Evaluation  Patient identified by MRN, date of birth, ID band Patient awake    Reviewed: Allergy & Precautions, NPO status , Patient's Chart, lab work & pertinent test results  History of Anesthesia Complications Negative for: history of anesthetic complications  Airway Mallampati: I  TM Distance: >3 FB Neck ROM: Full    Dental no notable dental hx. (+) Teeth Intact   Pulmonary neg pulmonary ROS, neg sleep apnea, neg COPD, Patient abstained from smoking.Not current smoker,    Pulmonary exam normal breath sounds clear to auscultation       Cardiovascular Exercise Tolerance: Good METShypertension, + CAD, + Past MI and + Cardiac Stents  (-) dysrhythmias  Rhythm:Regular Rate:Normal - Systolic murmurs ? Patient suffered a lateral NSTEMI on 12/22/2016.  He underwent a diagnostic left heart catheterization at [facility] in [place].  Apparently, there was a significant degree of obstructive CAD prompting subsequent PCI and placement of a DES (unknown type) to OM1.  ? Last TTE was performed on 01/30/2021 revealing a normal left ventricular systolic function with EF of 55%.  There were no regional wall motion abnormalities.  There was trivial to mild tricuspid and mitral valve regurgitation.  There was no evidence of a significant transvalvular gradient to suggest stenosis.   Neuro/Psych negative neurological ROS  negative psych ROS   GI/Hepatic neg GERD  ,(+)     (-) substance abuse  ,   Endo/Other  neg diabetes  Renal/GU negative Renal ROS     Musculoskeletal  (+) Arthritis ,   Abdominal   Peds  Hematology   Anesthesia Other Findings Past Medical History: No date: Arthritis 12/31/2016: Benign essential HTN 09/05/2020: Benign prostatic hyperplasia without lower urinary tract  symptoms No date: Bilateral inguinal hernia 05/26/2017: Bradycardia No date: Coronary artery disease involving native coronary  artery of  native heart without angina pectoris     Comment:  a.) lateral NSTEMI 12/22/2016 --> LHC 12/22/2016 --> PCI              placing a DES (unknown type) to OM1 09/05/2020: Elevated PSA No date: Erectile dysfunction     Comment:  a.) on PDE5i (sildenafil) 12/31/2016: Hyperlipidemia, mixed 12/22/2016: Lateral NSTEMI (non-ST elevated myocardial infarction)  [facility])     Comment:  a.) LHC 12/22/2016 at [facility] in [place] (Dr.               Posey Pronto) --> PCI placing a DES (unknown type) to OM1 11/13/2020: Prostate cancer [facility])     Comment:  a.) Gleason 4+3=7 prostate cancer; pT2N0MxR0 with PNI;               s/p RALP and bilateral PLND 03/12/21  Reproductive/Obstetrics                            Anesthesia Physical Anesthesia Plan  ASA: 3  Anesthesia Plan: General   Post-op Pain Management: Tylenol PO (pre-op)*   Induction: Intravenous  PONV Risk Score and Plan: 2 and Ondansetron and Dexamethasone  Airway Management Planned: LMA  Additional Equipment: None  Intra-op Plan:   Post-operative Plan: Extubation in OR  Informed Consent: I have reviewed the patients History and Physical, chart, labs and discussed the procedure including the risks, benefits and alternatives for the proposed anesthesia with the patient or authorized representative who has indicated his/her understanding and acceptance.     Dental advisory given  Plan Discussed with: CRNA and Surgeon  Anesthesia Plan Comments: (Discussed risks of anesthesia with patient, including PONV, sore throat, lip/dental/eye damage. Rare risks discussed as well, such as cardiorespiratory and neurological sequelae, and allergic reactions. Discussed the role of CRNA in patient's perioperative care. Patient understands.)       Anesthesia Quick Evaluation

## 2022-06-25 NOTE — Transfer of Care (Signed)
Immediate Anesthesia Transfer of Care Note  Patient: Adam Cline  Procedure(s) Performed: HERNIA REPAIR INGUINAL ADULT (Bilateral: Groin)  Patient Location: PACU  Anesthesia Type:General  Level of Consciousness: drowsy  Airway & Oxygen Therapy: Patient Spontanous Breathing and Patient connected to face mask oxygen  Post-op Assessment: Report given to RN and Post -op Vital signs reviewed and stable  Post vital signs: Reviewed and stable  Last Vitals:  Vitals Value Taken Time  BP 114/62 06/25/22 1345  Temp 36 C 06/25/22 1336  Pulse 65 06/25/22 1349  Resp 13 06/25/22 1349  SpO2 98 % 06/25/22 1349  Vitals shown include unvalidated device data.  Last Pain:  Vitals:   06/25/22 1336  TempSrc:   PainSc: 0-No pain         Complications: No notable events documented.

## 2022-06-25 NOTE — Discharge Instructions (Signed)
AMBULATORY SURGERY  DISCHARGE INSTRUCTIONS   The drugs that you were given will stay in your system until tomorrow so for the next 24 hours you should not:  Drive an automobile Make any legal decisions Drink any alcoholic beverage   You may resume regular meals tomorrow.  Today it is better to start with liquids and gradually work up to solid foods.  You may eat anything you prefer, but it is better to start with liquids, then soup and crackers, and gradually work up to solid foods.   Please notify your doctor immediately if you have any unusual bleeding, trouble breathing, redness and pain at the surgery site, drainage, fever, or pain not relieved by medication.    Additional Instructions:   Information for Discharge Teaching:  DO NOT REMOVE TEAL BAND FOR 4 DAYS (96 hours)  06/29/2022  EXPAREL (bupivacaine liposome injectable suspension)   Your surgeon or anesthesiologist gave you EXPAREL(bupivacaine) to help control your pain after surgery.  EXPAREL is a local anesthetic that provides pain relief by numbing the tissue around the surgical site. EXPAREL is designed to release pain medication over time and can control pain for up to 72 hours. Depending on how you respond to EXPAREL, you may require less pain medication during your recovery.  Possible side effects: Temporary loss of sensation or ability to move in the area where bupivacaine was injected. Nausea, vomiting, constipation Rarely, numbness and tingling in your mouth or lips, lightheadedness, or anxiety may occur. Call your doctor right away if you think you may be experiencing any of these sensations, or if you have other questions regarding possible side effects.  Follow all other discharge instructions given to you by your surgeon or nurse. Eat a healthy diet and drink plenty of water or other fluids.  If you return to the hospital for any reason within 96 hours following the administration of EXPAREL, it is important  for health care providers to know that you have received this anesthetic. A teal colored band has been placed on your arm with the date, time and amount of EXPAREL you have received in order to alert and inform your health care providers. Please leave this armband in place for the full 96 hours following administration, and then you may remove the band.         Please contact your physician with any problems or Same Day Surgery at [phone], Monday through Friday 6 am to 4 pm, or [place] at [facility] number at [phone].

## 2022-06-25 NOTE — Op Note (Signed)
Preoperative diagnosis: Bilateral inguinal hernia, symptomatic; status post robotic prostatectomy.  Postoperative diagnosis: Same.  Operative procedure: Bilateral inguinal hernia repair, indirect, with medium PerFix plug and patch.  Operating surgeon: Hervey Ard, MD.  Anesthesia: General by LMA, Exparel 20 cc, Marcaine 0.25%, plain 30 cc; Toradol: 60 mg.  Estimated blood loss: 20 cc.  Clinical note: This 61 year old male has developed symptomatic inguinal hernias over the last 18 months.  The left side is more symptomatic than the right.  While he had originally desired robotic repair in consultation with his treating urology service and [facility] he was encouraged to proceed to open repair.  The patient had hair removed and the surgical site with clippers prior to the procedure.  SCD stockings for DVT prevention.  He did receive Ancef on induction of anesthesia.  Operative note: With the patient under adequate general anesthesia the abdomen and groin was cleansed with ChloraPrep and draped.  Field block anesthesia was established on both sides.  Beginning on the left side a 5 cm skin line incision along anticipated course the inguinal canal was made.  The skin was incised sharply remaining dissection completed with electrocautery.  Hemostasis was electrocautery and 3-0 Vicryl ties.  The external oblique was opened and the cord was mobilized.  There was a generous indirect defect.  No evidence of a direct defect.  The cord was significantly scarred to the vas deferens with a tedious dissection.  2 lipomas of the cord were excised and discarded.  The hernia sac was dissected back to level the internal ring where it was transfixed with a 3-0 Vicryl suture after assuring no sac contents followed by 3-0 Vicryl tie excised and discarded.  A small distal portion of the sac was left in situ to minimize excessive bleeding.  A medium Bard PerFix plug was placed in the preperitoneal space and anchored to the  transverse abdominis aponeurosis and iliopubic tract with 0 Surgilon sutures.  An onlay mesh was placed anchored to the pubic tubercle.  0 Surgilon's were used for this as well as the inferior border to the inguinal ligament.  The medial and superior edges were anchored to the transverse abdominis aponeurosis.  The ilioinguinal and iliohypogastric nerves were identified and protected.  The external oblique layer was closed with a running 2-0 Vicryl suture.  Scarpa's fascia was closed with a running 3-0 Vicryl suture and the skin closed with a running 4-0 Vicryl subcuticular suture.  Attention was turned to the right groin.  This was approached as described above.  Very little scarring although a very large hernia sac.  Repair was completed described above with the medium Bard PerFix plug and patch.  Lipomas of the cord were excised and discarded.  The complete hernia sac was dissected from the cord structures and placed in the preperitoneal space.  The floor the inguinal canal was reinforced with a onlay mesh anchored as noted above.  The abdominal wall closure was completed as noted above.  Benzoin and Steri-Strips followed by Telfa and Tegaderm dressings were applied.  Patient tolerated the procedure well and was taken to the PACU in stable condition.

## 2022-06-26 NOTE — Anesthesia Postprocedure Evaluation (Signed)
Anesthesia Post Note  Patient: Adam Cline  Procedure(s) Performed: HERNIA REPAIR INGUINAL ADULT (Bilateral: Groin)  Patient location during evaluation: PACU Anesthesia Type: General Level of consciousness: awake and alert Pain management: pain level controlled Vital Signs Assessment: post-procedure vital signs reviewed and stable Respiratory status: spontaneous breathing, nonlabored ventilation, respiratory function stable and patient connected to nasal cannula oxygen Cardiovascular status: blood pressure returned to baseline and stable Postop Assessment: no apparent nausea or vomiting Anesthetic complications: no   No notable events documented.   Last Vitals:  Vitals:   06/25/22 1419 06/25/22 1455  BP: 126/65 128/68  Pulse: 70 70  Resp: 16 16  Temp: (!) 36.2 C 36.8 C  SpO2: 94% 98%    Last Pain:  Vitals:   06/25/22 1455  TempSrc: Temporal  PainSc:                  Arita Miss

## 2022-11-14 ENCOUNTER — Encounter: Payer: Self-pay | Admitting: Gastroenterology

## 2022-11-17 ENCOUNTER — Encounter
Admission: RE | Disposition: A | Discharge status: Home / Self Care | Payer: Self-pay | Source: Home / Self Care | Attending: Gastroenterology

## 2022-11-17 ENCOUNTER — Encounter: Payer: Self-pay | Admitting: Gastroenterology

## 2022-11-17 ENCOUNTER — Other Ambulatory Visit: Payer: Self-pay

## 2022-11-17 ENCOUNTER — Ambulatory Visit
Admission: RE | Admit: 2022-11-17 | Discharge: 2022-11-17 | Disposition: A | Discharge status: Home / Self Care | Payer: 59 | Attending: Gastroenterology | Admitting: Gastroenterology

## 2022-11-17 ENCOUNTER — Ambulatory Visit: Payer: 59 | Admitting: Anesthesiology

## 2022-11-17 DIAGNOSIS — K5730 Diverticulosis of large intestine without perforation or abscess without bleeding: Secondary | ICD-10-CM | POA: Insufficient documentation

## 2022-11-17 DIAGNOSIS — E782 Mixed hyperlipidemia: Secondary | ICD-10-CM | POA: Diagnosis not present

## 2022-11-17 DIAGNOSIS — Z79899 Other long term (current) drug therapy: Secondary | ICD-10-CM | POA: Insufficient documentation

## 2022-11-17 DIAGNOSIS — Z1211 Encounter for screening for malignant neoplasm of colon: Secondary | ICD-10-CM | POA: Diagnosis present

## 2022-11-17 DIAGNOSIS — K621 Rectal polyp: Secondary | ICD-10-CM | POA: Insufficient documentation

## 2022-11-17 DIAGNOSIS — I10 Essential (primary) hypertension: Secondary | ICD-10-CM | POA: Diagnosis not present

## 2022-11-17 DIAGNOSIS — Z8546 Personal history of malignant neoplasm of prostate: Secondary | ICD-10-CM | POA: Diagnosis not present

## 2022-11-17 DIAGNOSIS — D123 Benign neoplasm of transverse colon: Secondary | ICD-10-CM | POA: Diagnosis not present

## 2022-11-17 DIAGNOSIS — I2510 Atherosclerotic heart disease of native coronary artery without angina pectoris: Secondary | ICD-10-CM | POA: Insufficient documentation

## 2022-11-17 DIAGNOSIS — Z955 Presence of coronary angioplasty implant and graft: Secondary | ICD-10-CM | POA: Diagnosis not present

## 2022-11-17 DIAGNOSIS — K640 First degree hemorrhoids: Secondary | ICD-10-CM | POA: Diagnosis not present

## 2022-11-17 DIAGNOSIS — I252 Old myocardial infarction: Secondary | ICD-10-CM | POA: Insufficient documentation

## 2022-11-17 HISTORY — PX: COLONOSCOPY: SHX5424

## 2022-11-17 SURGERY — COLONOSCOPY
Anesthesia: General

## 2022-11-17 MED ORDER — PROPOFOL 500 MG/50ML IV EMUL
INTRAVENOUS | Status: DC | PRN
  Administered 2022-11-17: 150 ug/kg/min via INTRAVENOUS

## 2022-11-17 MED ORDER — PROPOFOL 1000 MG/100ML IV EMUL
INTRAVENOUS | Status: AC
  Filled 2022-11-17: qty 100

## 2022-11-17 MED ORDER — LIDOCAINE HCL (PF) 2 % IJ SOLN
INTRAMUSCULAR | Status: AC
  Filled 2022-11-17: qty 5

## 2022-11-17 MED ORDER — LIDOCAINE HCL (CARDIAC) PF 100 MG/5ML IV SOSY
PREFILLED_SYRINGE | INTRAVENOUS | Status: DC | PRN
  Administered 2022-11-17: 50 mg via INTRAVENOUS

## 2022-11-17 MED ORDER — STERILE WATER FOR IRRIGATION IR SOLN
Status: DC | PRN
  Administered 2022-11-17: 50 mL

## 2022-11-17 MED ORDER — SODIUM CHLORIDE 0.9 % IV SOLN: INTRAVENOUS | Status: DC

## 2022-11-17 MED ORDER — PROPOFOL 10 MG/ML IV BOLUS
INTRAVENOUS | Status: DC | PRN
  Administered 2022-11-17: 70 mg via INTRAVENOUS
  Administered 2022-11-17: 30 mg via INTRAVENOUS

## 2022-11-17 NOTE — H&P (Signed)
Pre-Procedure H&P   Patient ID: Adam Cline is a 61 y.o. male.  Gastroenterology Provider: Annamaria Helling, DO  Referring Provider: Dr. Doy Hutching PCP: Idelle Crouch, MD  Date: 11/17/2022  HPI Adam Cline is a 61 y.o. male who presents today for Colonoscopy for initial screening colonoscopy.  Patient undergoing first colonoscopy today.  Reports regular bowel movements melena or hematochezia constipation or diarrhea  No family history of colon cancer or colon polyps.  The patient underwent a prostatectomy for prostate cancer.  He is also had a heart stent from an MI back in 2018 but is not on any antiplatelet or anticoagulation.  Most recent lab work hemoglobin 14.4 MCV 92.6 platelets 221,000  Past Medical History:  Diagnosis Date   Arthritis    Benign essential HTN 12/31/2016   Benign prostatic hyperplasia without lower urinary tract symptoms 09/05/2020   Bilateral inguinal hernia    Bradycardia 05/26/2017   Coronary artery disease involving native coronary artery of native heart without angina pectoris    a.) lateral NSTEMI 12/22/2016 --> LHC 12/22/2016 --> PCI placing a DES (unknown type) to OM1   Elevated PSA 09/05/2020   Erectile dysfunction    a.) on PDE5i (sildenafil)   Hyperlipidemia, mixed 12/31/2016   Lateral NSTEMI (non-ST elevated myocardial infarction) ([place]) 12/22/2016   a.) LHC 12/22/2016 at [facility] in [place] (Dr. Posey Pronto) --> PCI placing a DES (unknown type) to OM1   Prostate cancer ([place]) 11/13/2020   a.) Gleason 4+3=7 prostate cancer; pT2N0MxR0 with PNI; s/p RALP and bilateral PLND 03/12/21    Past Surgical History:  Procedure Laterality Date   CORONARY ANGIOPLASTY WITH STENT PLACEMENT Left 12/22/2016   Procedure: CORONARY ANGIOPLASTY WITH STENT PLACEMENT; Location: [facility] in [place]; Surgeon: Posey Pronto, MD   INGUINAL HERNIA REPAIR Bilateral 06/25/2022   Procedure: HERNIA REPAIR INGUINAL ADULT;  Surgeon: Robert Bellow, MD;   Location: [facility] ORS;  Service: General;  Laterality: Bilateral;  Floyce Stakes, RNFA to assist   LAPAROSCOPY W/ TOTAL BILATERAL PELVIC AND PERI-AORTIC LYMPHADENECTOMY N/A 03/02/2021   ROBOT ASSISTED LAPAROSCOPIC RADICAL PROSTATECTOMY N/A 03/02/2021    Family History No h/o GI disease or malignancy  Review of Systems  Constitutional:  Negative for activity change, appetite change, chills, diaphoresis, fatigue, fever and unexpected weight change.  HENT:  Negative for trouble swallowing and voice change.   Respiratory:  Negative for shortness of breath and wheezing.   Cardiovascular:  Negative for chest pain, palpitations and leg swelling.  Gastrointestinal:  Negative for abdominal distention, abdominal pain, anal bleeding, blood in stool, constipation, diarrhea, nausea and vomiting.  Musculoskeletal:  Negative for arthralgias and myalgias.  Skin:  Negative for color change and pallor.  Neurological:  Negative for dizziness, syncope and weakness.  Psychiatric/Behavioral:  Negative for confusion. The patient is not nervous/anxious.   All other systems reviewed and are negative.    Medications No current facility-administered medications on file prior to encounter.   Current Outpatient Medications on File Prior to Encounter  Medication Sig Dispense Refill   aspirin EC 81 MG tablet 1 tablet once daily.     ezetimibe (ZETIA) 10 MG tablet Take 10 mg by mouth every morning.     lisinopril (PRINIVIL,ZESTRIL) 10 MG tablet Take 10 mg by mouth at bedtime.     Ascorbic Acid (VITAMIN C) 1000 MG tablet Take 2,000 mg by mouth daily.     Cholecalciferol (VITAMIN D3 PO) Take 1 tablet by mouth daily at 6 (six) AM.  Coenzyme Q10 (CO Q 10 PO) Take 1 tablet by mouth daily at 6 (six) AM.     Collagen-Vitamin C (COLLAGEN PLUS VITAMIN C PO) Take 1 Scoop by mouth daily at 6 (six) AM.     diclofenac Sodium (VOLTAREN) 1 % GEL Apply 4 g topically as needed.     Ferrous Sulfate (IRON PO) Take 1 tablet by  mouth daily.     Flaxseed, Linseed, (FLAXSEED OIL PO) Take 1 tablet by mouth daily at 6 (six) AM.     fluticasone (FLONASE) 50 MCG/ACT nasal spray Place 2 sprays into both nostrils as needed.     GLUCOSAMINE-CHONDROITIN-MSM-D PO Take 1 tablet by mouth daily at 6 (six) AM.     HYDROcodone-acetaminophen (NORCO/VICODIN) 5-325 MG tablet Take 1 tablet by mouth every 4 (four) hours as needed for moderate pain. 15 tablet 0   Psyllium (METAMUCIL PO) Take 1 Scoop by mouth daily at 6 (six) AM.     Sildenafil Citrate (VIAGRA PO) Take 1 tablet by mouth as needed.     TURMERIC CURCUMIN PO Take 1 tablet by mouth daily at 6 (six) AM.      Pertinent medications related to GI and procedure were reviewed by me with the patient prior to the procedure   Current Facility-Administered Medications:    0.9 %  sodium chloride infusion, , Intravenous, Continuous, Annamaria Helling, DO  sodium chloride         No Known Allergies Allergies were reviewed by me prior to the procedure  Objective   Body mass index is 23.63 kg/m. Vitals:   11/17/22 0704  BP: 135/72  Pulse: 73  Resp: 16  Temp: (!) 97.1 F (36.2 C)  TempSrc: Temporal  SpO2: 100%  Weight: 72.6 kg  Height: '5\' 9"'$  (1.753 m)     Physical Exam Vitals and nursing note reviewed.  Constitutional:      General: He is not in acute distress.    Appearance: Normal appearance. He is not ill-appearing, toxic-appearing or diaphoretic.  HENT:     Head: Normocephalic and atraumatic.     Nose: Nose normal.     Mouth/Throat:     Mouth: Mucous membranes are moist.     Pharynx: Oropharynx is clear.  Eyes:     General: No scleral icterus.    Extraocular Movements: Extraocular movements intact.  Cardiovascular:     Rate and Rhythm: Normal rate and regular rhythm.     Heart sounds: Normal heart sounds. No murmur heard.    No friction rub. No gallop.  Pulmonary:     Effort: Pulmonary effort is normal. No respiratory distress.     Breath sounds:  Normal breath sounds. No wheezing, rhonchi or rales.  Abdominal:     General: Bowel sounds are normal. There is no distension.     Palpations: Abdomen is soft.     Tenderness: There is no abdominal tenderness. There is no guarding or rebound.  Musculoskeletal:     Cervical back: Neck supple.     Right lower leg: No edema.     Left lower leg: No edema.  Skin:    General: Skin is warm and dry.     Coloration: Skin is not jaundiced or pale.  Neurological:     General: No focal deficit present.     Mental Status: He is alert and oriented to person, place, and time. Mental status is at baseline.  Psychiatric:        Mood and Affect: Mood  normal.        Behavior: Behavior normal.        Thought Content: Thought content normal.        Judgment: Judgment normal.      Assessment:  Adam Cline is a 61 y.o. male  who presents today for Colonoscopy for initial screening colonoscopy .  Plan:  Colonoscopy with possible intervention today  Colonoscopy with possible biopsy, control of bleeding, polypectomy, and interventions as necessary has been discussed with the patient/patient representative. Informed consent was obtained from the patient/patient representative after explaining the indication, nature, and risks of the procedure including but not limited to death, bleeding, perforation, missed neoplasm/lesions, cardiorespiratory compromise, and reaction to medications. Opportunity for questions was given and appropriate answers were provided. Patient/patient representative has verbalized understanding is amenable to undergoing the procedure.   Annamaria Helling, DO  [facility] Gastroenterology  Portions of the record may have been created with voice recognition software. Occasional wrong-word or 'sound-a-like' substitutions may have occurred due to the inherent limitations of voice recognition software.  Read the chart carefully and recognize, using context, where substitutions may  have occurred.

## 2022-11-17 NOTE — Interval H&P Note (Signed)
History and Physical Interval Note: Preprocedure H&P from 11/17/22  was reviewed and there was no interval change after seeing and examining the patient.  Written consent was obtained from the patient after discussion of risks, benefits, and alternatives. Patient has consented to proceed with Colonoscopy with possible intervention   11/17/2022 7:32 AM  [name]  has presented today for surgery, with the diagnosis of Colon cancer screening (Z12.11).  The various methods of treatment have been discussed with the patient and family. After consideration of risks, benefits and other options for treatment, the patient has consented to  Procedure(s): COLONOSCOPY (N/A) as a surgical intervention.  The patient's history has been reviewed, patient examined, no change in status, stable for surgery.  I have reviewed the patient's chart and labs.  Questions were answered to the patient's satisfaction.     Adam Cline

## 2022-11-17 NOTE — Anesthesia Postprocedure Evaluation (Signed)
Anesthesia Post Note  Patient: Adam Cline  Procedure(s) Performed: COLONOSCOPY  Patient location during evaluation: PACU Anesthesia Type: General Level of consciousness: awake Pain management: pain level controlled Vital Signs Assessment: post-procedure vital signs reviewed and stable Respiratory status: spontaneous breathing Cardiovascular status: stable Anesthetic complications: no  No notable events documented.   Last Vitals:  Vitals:   11/17/22 0826 11/17/22 0836  BP: (!) 101/44 118/69  Pulse: 74 74  Resp: 14 13  Temp:    SpO2: 100% 94%    Last Pain:  Vitals:   11/17/22 0836  TempSrc:   PainSc: 0-No pain                 VAN STAVEREN,Kairo Laubacher

## 2022-11-17 NOTE — Op Note (Signed)
[facility] Patient Name: Adam Cline Procedure Date: 11/17/2022 7:36 AM MRN: [id number] Account #: [id number] Date of Birth: 21-Oct-1961 Admit Type: Outpatient Age: 61 Room: [facility] ENDO ROOM 2 Gender: Male Note Status: Finalized Instrument Name: Colonoscope [id number] Procedure:             Colonoscopy Indications:           Screening for colorectal malignant neoplasm Providers:             Annamaria Helling DO, DO Medicines:             Monitored Anesthesia Care Complications:         No immediate complications. Estimated blood loss:                         Minimal. Procedure:             Pre-Anesthesia Assessment:                        - Prior to the procedure, a History and Physical was                         performed, and patient medications and allergies were                         reviewed. The patient is competent. The risks and                         benefits of the procedure and the sedation options and                         risks were discussed with the patient. All questions                         were answered and informed consent was obtained.                         Patient identification and proposed procedure were                         verified by the physician, the nurse, the anesthetist                         and the technician in the endoscopy suite. Mental                         Status Examination: alert and oriented. Airway                         Examination: normal oropharyngeal airway and neck                         mobility. Respiratory Examination: clear to                         auscultation. CV Examination: RRR, no murmurs, no S3                         or S4. Prophylactic Antibiotics: The patient does not  require prophylactic antibiotics. Prior                         Anticoagulants: The patient has taken no anticoagulant                         or antiplatelet agents. ASA Grade  Assessment: II - A                         patient with mild systemic disease. After reviewing                         the risks and benefits, the patient was deemed in                         satisfactory condition to undergo the procedure. The                         anesthesia plan was to use monitored anesthesia care                         (MAC). Immediately prior to administration of                         medications, the patient was re-assessed for adequacy                         to receive sedatives. The heart rate, respiratory                         rate, oxygen saturations, blood pressure, adequacy of                         pulmonary ventilation, and response to care were                         monitored throughout the procedure. The physical                         status of the patient was re-assessed after the                         procedure.                        After obtaining informed consent, the colonoscope was                         passed under direct vision. Throughout the procedure,                         the patient's blood pressure, pulse, and oxygen                         saturations were monitored continuously. The                         Colonoscope was introduced through the anus and  advanced to the the terminal ileum, with                         identification of the appendiceal orifice and IC                         valve. The colonoscopy was performed without                         difficulty. The patient tolerated the procedure well.                         The quality of the bowel preparation was evaluated                         using the BBPS [facility] Bowel Preparation Scale) with                         scores of: Right Colon = 3, Transverse Colon = 3 and                         Left Colon = 3 (entire mucosa seen well with no                         residual staining, small fragments of stool or opaque                          liquid). The total BBPS score equals 9. The terminal                         ileum, ileocecal valve, appendiceal orifice, and                         rectum were photographed. Findings:      The perianal and digital rectal examinations were normal. Pertinent       negatives include normal sphincter tone.      The terminal ileum appeared normal. Estimated blood loss: none.      A 1 to 2 mm polyp was found in the transverse colon. The polyp was       sessile. The polyp was removed with a jumbo cold forceps. Resection and       retrieval were complete. Estimated blood loss was minimal.      Anal papilla(e) were hypertrophied. Biopsies were taken with a cold       forceps for histology. Estimated blood loss was minimal.      A submucosal/extrinsic appearing non-obstructing medium-sized mass was       found in the cecum. The mass was non-circumferential. The mass measured       three cm in length. No bleeding was present. Biopsies were taken with a       cold forceps for histology. Superficial biopsies of overlying mucosa       taken. Mass area appears to be extrinsic to the colon as the mucosa       moved freely over it, however, area was hard with no indentation to       biopsy forceps. Estimated blood loss: none.      A few small-mouthed diverticula were found in  the recto-sigmoid colon       and sigmoid colon. Estimated blood loss: none.      Non-bleeding internal hemorrhoids were found during retroflexion. The       hemorrhoids were Grade I (internal hemorrhoids that do not prolapse).       Estimated blood loss: none.      Retroflexion in the right colon was performed.      The exam was otherwise without abnormality on direct and retroflexion       views. Impression:            - The examined portion of the ileum was normal.                        - One 1 to 2 mm polyp in the transverse colon, removed                         with a jumbo cold forceps. Resected and retrieved.                         - Anal papilla(e) were hypertrophied. Biopsied.                        - Tumor in the cecum. Biopsied.                        - Diverticulosis in the recto-sigmoid colon and in the                         sigmoid colon.                        - Non-bleeding internal hemorrhoids.                        - The examination was otherwise normal on direct and                         retroflexion views. Recommendation:        - Patient has a contact number available for                         emergencies. The signs and symptoms of potential                         delayed complications were discussed with the patient.                         Return to normal activities tomorrow. Written                         discharge instructions were provided to the patient.                        - Discharge patient to home.                        - Resume previous diet.                        - Continue present medications.                        -  Await pathology results.                        - Repeat colonoscopy for surveillance based on                         pathology results.                        - Return to GI clinic at appointment to be scheduled.                        - Plan for imaging of cecum                        - The findings and recommendations were discussed with                         the patient. Procedure Code(s):     --- Professional ---                        [phone], Colonoscopy, flexible; with biopsy, single or                         multiple Diagnosis Code(s):     --- Professional ---                        Z12.11, Encounter for screening for malignant neoplasm                         of colon                        K64.0, First degree hemorrhoids                        D12.3, Benign neoplasm of transverse colon (hepatic                         flexure or splenic flexure)                        K62.89, Other specified diseases of anus and rectum                         D49.0, Neoplasm of unspecified behavior of digestive                         system                        K57.30, Diverticulosis of large intestine without                         perforation or abscess without bleeding CPT copyright 2022 American [employer] Association. All rights reserved. The codes documented in this report are preliminary and upon coder review may  be revised to meet current compliance requirements. Attending Participation:      I personally performed the entire procedure. Volney American, DO Annamaria Helling DO, DO 11/17/2022 8:19:06 AM This report has been signed electronically. Number of Addenda:  0 Note Initiated On: 11/17/2022 7:36 AM Scope Withdrawal Time: 0 hours 19 minutes 17 seconds  Total Procedure Duration: 0 hours 28 minutes 17 seconds  Estimated Blood Loss:  Estimated blood loss was minimal.      [facility]

## 2022-11-17 NOTE — Transfer of Care (Signed)
Immediate Anesthesia Transfer of Care Note  Patient: Adam Cline  Procedure(s) Performed: COLONOSCOPY  Patient Location: PACU  Anesthesia Type:General  Level of Consciousness: sedated  Airway & Oxygen Therapy: Patient Spontanous Breathing  Post-op Assessment: Report given to RN and Post -op Vital signs reviewed and stable  Post vital signs: Reviewed and stable  Last Vitals:  Vitals Value Taken Time  BP 95/53 11/17/22 0815  Temp 35.7 C 11/17/22 0814  Pulse 61 11/17/22 0815  Resp 12 11/17/22 0815  SpO2 100 % 11/17/22 0815  Vitals shown include unvalidated device data.  Last Pain:  Vitals:   11/17/22 0814  TempSrc: Temporal  PainSc: Asleep         Complications: No notable events documented.

## 2022-11-17 NOTE — Anesthesia Preprocedure Evaluation (Signed)
Anesthesia Evaluation  Patient identified by MRN, date of birth, ID band Patient awake    Reviewed: Allergy & Precautions, NPO status , Patient's Chart, lab work & pertinent test results  Airway Mallampati: II  TM Distance: >3 FB Neck ROM: Full    Dental  (+) Teeth Intact   Pulmonary neg pulmonary ROS   Pulmonary exam normal breath sounds clear to auscultation       Cardiovascular Exercise Tolerance: Good hypertension, Pt. on medications + CAD, + Past MI and + Cardiac Stents  negative cardio ROS Normal cardiovascular exam Rhythm:Regular     Neuro/Psych negative neurological ROS  negative psych ROS   GI/Hepatic negative GI ROS, Neg liver ROS,,,  Endo/Other  negative endocrine ROS    Renal/GU negative Renal ROS  negative genitourinary   Musculoskeletal   Abdominal Normal abdominal exam  (+)   Peds negative pediatric ROS (+)  Hematology negative hematology ROS (+)   Anesthesia Other Findings Past Medical History: No date: Arthritis 12/31/2016: Benign essential HTN 09/05/2020: Benign prostatic hyperplasia without lower urinary tract  symptoms No date: Bilateral inguinal hernia 05/26/2017: Bradycardia No date: Coronary artery disease involving native coronary artery of  native heart without angina pectoris     Comment:  a.) lateral NSTEMI 12/22/2016 --> LHC 12/22/2016 --> PCI              placing a DES (unknown type) to OM1 09/05/2020: Elevated PSA No date: Erectile dysfunction     Comment:  a.) on PDE5i (sildenafil) 12/31/2016: Hyperlipidemia, mixed 12/22/2016: Lateral NSTEMI (non-ST elevated myocardial infarction)  [facility])     Comment:  a.) LHC 12/22/2016 at [facility] in [place] (Dr.               Posey Pronto) --> PCI placing a DES (unknown type) to OM1 11/13/2020: Prostate cancer [facility])     Comment:  a.) Gleason 4+3=7 prostate cancer; [id number] with PNI;               s/p RALP and bilateral PLND  03/12/21  Past Surgical History: 12/22/2016: CORONARY ANGIOPLASTY WITH STENT PLACEMENT; Left     Comment:  Procedure: CORONARY ANGIOPLASTY WITH STENT PLACEMENT;               Location: [facility] in [place]; Surgeon: Posey Pronto,               MD 06/25/2022: INGUINAL HERNIA REPAIR; Bilateral     Comment:  Procedure: HERNIA REPAIR INGUINAL ADULT;  Surgeon:               Robert Bellow, MD;  Location: [facility] ORS;  Service:               General;  Laterality: Bilateral;  Floyce Stakes, RNFA               to assist 03/02/2021: LAPAROSCOPY W/ TOTAL BILATERAL PELVIC AND PERI-AORTIC  LYMPHADENECTOMY; N/A 03/02/2021: ROBOT ASSISTED LAPAROSCOPIC RADICAL PROSTATECTOMY; N/A  BMI    Body Mass Index: 23.63 kg/m      Reproductive/Obstetrics negative OB ROS                             Anesthesia Physical Anesthesia Plan  ASA: 2  Anesthesia Plan: General   Post-op Pain Management:    Induction: Intravenous  PONV Risk Score and Plan: Propofol infusion and TIVA  Airway Management Planned: Natural Airway  Additional Equipment:   Intra-op Plan:  Post-operative Plan:   Informed Consent: I have reviewed the patients History and Physical, chart, labs and discussed the procedure including the risks, benefits and alternatives for the proposed anesthesia with the patient or authorized representative who has indicated his/her understanding and acceptance.     Dental Advisory Given  Plan Discussed with: CRNA and Surgeon  Anesthesia Plan Comments:        Anesthesia Quick Evaluation

## 2022-11-17 NOTE — Anesthesia Procedure Notes (Signed)
Date/Time: 11/17/2022 7:39 AM  Performed by: Johnna Acosta, CRNAPre-anesthesia Checklist: Patient identified, Emergency Drugs available, Suction available, Patient being monitored and Timeout performed Patient Re-evaluated:Patient Re-evaluated prior to induction Oxygen Delivery Method: Nasal cannula Preoxygenation: Pre-oxygenation with 100% oxygen Induction Type: IV induction

## 2022-11-18 ENCOUNTER — Encounter: Payer: Self-pay | Admitting: Gastroenterology

## 2022-11-18 LAB — SURGICAL PATHOLOGY

## 2022-11-25 ENCOUNTER — Other Ambulatory Visit: Payer: Self-pay | Admitting: Gastroenterology

## 2022-11-25 DIAGNOSIS — R1903 Right lower quadrant abdominal swelling, mass and lump: Secondary | ICD-10-CM

## 2022-12-03 ENCOUNTER — Ambulatory Visit
Admission: RE | Admit: 2022-12-03 | Discharge: 2022-12-03 | Disposition: A | Discharge status: Home / Self Care | Payer: 59 | Source: Ambulatory Visit | Attending: Gastroenterology | Admitting: Gastroenterology

## 2022-12-03 DIAGNOSIS — R1903 Right lower quadrant abdominal swelling, mass and lump: Secondary | ICD-10-CM | POA: Insufficient documentation

## 2022-12-03 LAB — POCT I-STAT CREATININE: Creatinine, Ser: 1.1 mg/dL (ref 0.61–1.24)

## 2022-12-03 MED ORDER — IOHEXOL 300 MG/ML  SOLN
100.0000 mL | Freq: Once | INTRAMUSCULAR | Status: AC | PRN
  Administered 2022-12-03: 100 mL via INTRAVENOUS

## 2025-01-11 ENCOUNTER — Other Ambulatory Visit
Admission: RE | Admit: 2025-01-11 | Discharge: 2025-01-11 | Disposition: A | Discharge status: Home / Self Care | Payer: Self-pay | Source: Ambulatory Visit | Attending: Internal Medicine | Admitting: Internal Medicine

## 2025-01-11 DIAGNOSIS — Z79899 Other long term (current) drug therapy: Secondary | ICD-10-CM | POA: Insufficient documentation

## 2025-01-11 LAB — CBC WITH DIFFERENTIAL/PLATELET
Abs Immature Granulocytes: 0.01 K/uL (ref 0.00–0.07)
Basophils Absolute: 0 K/uL (ref 0.0–0.1)
Basophils Relative: 0 %
Eosinophils Absolute: 0 K/uL (ref 0.0–0.5)
Eosinophils Relative: 0 %
HCT: 36.5 % — ABNORMAL LOW (ref 39.0–52.0)
Hemoglobin: 11.9 g/dL — ABNORMAL LOW (ref 13.0–17.0)
Immature Granulocytes: 0 %
Lymphocytes Relative: 67 %
Lymphs Abs: 6.8 K/uL — ABNORMAL HIGH (ref 0.7–4.0)
MCH: 27.6 pg (ref 26.0–34.0)
MCHC: 32.6 g/dL (ref 30.0–36.0)
MCV: 84.7 fL (ref 80.0–100.0)
Monocytes Absolute: 0.7 K/uL (ref 0.1–1.0)
Monocytes Relative: 7 %
Neutro Abs: 2.7 K/uL (ref 1.7–7.7)
Neutrophils Relative %: 26 %
Platelets: 92 K/uL — ABNORMAL LOW (ref 150–400)
RBC: 4.31 MIL/uL (ref 4.22–5.81)
RDW: 13.2 % (ref 11.5–15.5)
Smear Review: NORMAL
WBC: 10.3 K/uL (ref 4.0–10.5)
nRBC: 0 % (ref 0.0–0.2)

## 2025-01-19 ENCOUNTER — Other Ambulatory Visit
Admission: RE | Admit: 2025-01-19 | Discharge: 2025-01-19 | Disposition: A | Discharge status: Home / Self Care | Payer: Self-pay | Source: Ambulatory Visit | Attending: Internal Medicine | Admitting: Internal Medicine

## 2025-01-19 DIAGNOSIS — R10812 Left upper quadrant abdominal tenderness: Secondary | ICD-10-CM | POA: Insufficient documentation

## 2025-01-19 DIAGNOSIS — D649 Anemia, unspecified: Secondary | ICD-10-CM | POA: Insufficient documentation

## 2025-01-19 LAB — CBC WITH DIFFERENTIAL/PLATELET
Abs Immature Granulocytes: 0.01 K/uL (ref 0.00–0.07)
Basophils Absolute: 0 K/uL (ref 0.0–0.1)
Basophils Relative: 0 %
Eosinophils Absolute: 0.1 K/uL (ref 0.0–0.5)
Eosinophils Relative: 0 %
HCT: 36.7 % — ABNORMAL LOW (ref 39.0–52.0)
Hemoglobin: 12.2 g/dL — ABNORMAL LOW (ref 13.0–17.0)
Immature Granulocytes: 0 %
Lymphocytes Relative: 74 %
Lymphs Abs: 9.5 K/uL — ABNORMAL HIGH (ref 0.7–4.0)
MCH: 27.9 pg (ref 26.0–34.0)
MCHC: 33.2 g/dL (ref 30.0–36.0)
MCV: 84 fL (ref 80.0–100.0)
Monocytes Absolute: 0.7 K/uL (ref 0.1–1.0)
Monocytes Relative: 5 %
Neutro Abs: 2.8 K/uL (ref 1.7–7.7)
Neutrophils Relative %: 21 %
Platelets: 103 K/uL — ABNORMAL LOW (ref 150–400)
RBC: 4.37 MIL/uL (ref 4.22–5.81)
RDW: 13.3 % (ref 11.5–15.5)
Smear Review: NORMAL
WBC: 13.1 K/uL — ABNORMAL HIGH (ref 4.0–10.5)
nRBC: 0 % (ref 0.0–0.2)

## 2025-01-20 ENCOUNTER — Other Ambulatory Visit: Payer: Self-pay | Admitting: Internal Medicine

## 2025-01-20 DIAGNOSIS — R10812 Left upper quadrant abdominal tenderness: Secondary | ICD-10-CM

## 2025-01-20 LAB — PATHOLOGIST SMEAR REVIEW

## 2025-01-20 NOTE — Progress Notes (Signed)
 ENCOUNTER: Patient Class :No patient class for patient encounter Department: [facility] [address] [place] [address]  PATIENT: Patient Demographics      Name Patient ID SSN Gender Identity Birth Date   Shamarr, Faucett [id number] Male [birth year] (63 yrs)          Address Phone Email       [address] [place] [address] [phone] (H) [email or web address]            [place] Race         [facility] Caucasian/White             Reg Status PCP Date Last Verified Next Review Date     Verified Auston Reyes BIRCH [phone] 01/19/25 02/18/25           Marital Status Religion Language       Married Catholic English              EMERGENCY CONTACT: Name Relationship Lgl Grd Work Administrator, Sports  1. PREVIN, JIAN Spouse   [phone]    GUARANTOR: There is no guarantor information entered for this encounter.  COVERAGE: Primary Visit Coverage      Payer Plan Group Number Group Name Payer Phone Plan Phone   No coverage found                Secondary Visit Coverage      Payer Plan Group Number Group Name Payer Phone Plan Phone   No coverage found                Primary Coverage      Payer Plan Group Number Group Name Payer Phone Plan Phone   BLUE [place]OSS OOS [place] [id number] [employer] BFT TR-DIOC[employer] OF [facility] [id number]  [id number]           Primary Subscriber      Subscriber ID Subscriber Name Subscriber [facility] Subscriber Address   [id number] [facility] [id number] [address]      [place], [place] [address]           Secondary Coverage      Payer Plan Group Number Group Name Payer Phone Plan Phone   No coverage found

## 2025-01-26 ENCOUNTER — Ambulatory Visit
Admission: RE | Admit: 2025-01-26 | Discharge: 2025-01-26 | Disposition: A | Discharge status: Home / Self Care | Source: Ambulatory Visit | Attending: Internal Medicine | Admitting: Internal Medicine

## 2025-01-26 DIAGNOSIS — R10812 Left upper quadrant abdominal tenderness: Secondary | ICD-10-CM | POA: Diagnosis present

## 2025-02-01 ENCOUNTER — Inpatient Hospital Stay: Admitting: Oncology

## 2025-02-01 ENCOUNTER — Other Ambulatory Visit: Payer: Self-pay | Admitting: Internal Medicine

## 2025-02-01 ENCOUNTER — Encounter: Payer: Self-pay | Admitting: Oncology

## 2025-02-01 ENCOUNTER — Other Ambulatory Visit: Payer: Self-pay

## 2025-02-01 ENCOUNTER — Inpatient Hospital Stay

## 2025-02-01 VITALS — BP 131/73 | HR 78 | Temp 97.3°F | Resp 18 | Wt 184.7 lb

## 2025-02-01 DIAGNOSIS — D696 Thrombocytopenia, unspecified: Secondary | ICD-10-CM | POA: Diagnosis not present

## 2025-02-01 DIAGNOSIS — R161 Splenomegaly, not elsewhere classified: Secondary | ICD-10-CM

## 2025-02-01 DIAGNOSIS — D72820 Lymphocytosis (symptomatic): Secondary | ICD-10-CM

## 2025-02-01 DIAGNOSIS — Z809 Family history of malignant neoplasm, unspecified: Secondary | ICD-10-CM

## 2025-02-01 DIAGNOSIS — Z79899 Other long term (current) drug therapy: Secondary | ICD-10-CM

## 2025-02-01 DIAGNOSIS — D649 Anemia, unspecified: Secondary | ICD-10-CM

## 2025-02-01 DIAGNOSIS — Z808 Family history of malignant neoplasm of other organs or systems: Secondary | ICD-10-CM

## 2025-02-01 DIAGNOSIS — E538 Deficiency of other specified B group vitamins: Secondary | ICD-10-CM | POA: Insufficient documentation

## 2025-02-01 DIAGNOSIS — Z8249 Family history of ischemic heart disease and other diseases of the circulatory system: Secondary | ICD-10-CM | POA: Diagnosis not present

## 2025-02-01 DIAGNOSIS — R5383 Other fatigue: Secondary | ICD-10-CM

## 2025-02-01 LAB — CBC WITH DIFFERENTIAL/PLATELET
Abs Immature Granulocytes: 0.02 10*3/uL (ref 0.00–0.07)
Basophils Absolute: 0 10*3/uL (ref 0.0–0.1)
Basophils Relative: 0 %
Eosinophils Absolute: 0 10*3/uL (ref 0.0–0.5)
Eosinophils Relative: 0 %
HCT: 37.1 % — ABNORMAL LOW (ref 39.0–52.0)
Hemoglobin: 12 g/dL — ABNORMAL LOW (ref 13.0–17.0)
Immature Granulocytes: 0 %
Lymphocytes Relative: 75 %
Lymphs Abs: 10 10*3/uL — ABNORMAL HIGH (ref 0.7–4.0)
MCH: 27.2 pg (ref 26.0–34.0)
MCHC: 32.3 g/dL (ref 30.0–36.0)
MCV: 84.1 fL (ref 80.0–100.0)
Monocytes Absolute: 0.7 10*3/uL (ref 0.1–1.0)
Monocytes Relative: 5 %
Neutro Abs: 2.7 10*3/uL (ref 1.7–7.7)
Neutrophils Relative %: 20 %
Platelets: 88 10*3/uL — ABNORMAL LOW (ref 150–400)
RBC: 4.41 MIL/uL (ref 4.22–5.81)
RDW: 13.5 % (ref 11.5–15.5)
WBC: 13.4 10*3/uL — ABNORMAL HIGH (ref 4.0–10.5)
nRBC: 0 % (ref 0.0–0.2)

## 2025-02-01 LAB — HEPATITIS PANEL, ACUTE
HCV Ab: NONREACTIVE
Hep A IgM: NONREACTIVE
Hep B C IgM: NONREACTIVE
Hepatitis B Surface Ag: NONREACTIVE

## 2025-02-01 LAB — RETIC PANEL
Immature Retic Fract: 10.6 % (ref 2.3–15.9)
RBC.: 4.34 MIL/uL (ref 4.22–5.81)
Retic Count, Absolute: 52.9 10*3/uL (ref 19.0–186.0)
Retic Ct Pct: 1.2 % (ref 0.4–3.1)
Reticulocyte Hemoglobin: 28.9 pg

## 2025-02-01 LAB — FOLATE: Folate: 5.1 ng/mL — ABNORMAL LOW

## 2025-02-01 LAB — MONONUCLEOSIS SCREEN: Mono Screen: NEGATIVE

## 2025-02-01 LAB — HIV ANTIBODY (ROUTINE TESTING W REFLEX): HIV Screen 4th Generation wRfx: NONREACTIVE

## 2025-02-01 LAB — LACTATE DEHYDROGENASE: LDH: 351 U/L — ABNORMAL HIGH (ref 105–235)

## 2025-02-01 MED ORDER — FOLIC ACID 1 MG PO TABS: 1.0000 mg | ORAL_TABLET | Freq: Every day | ORAL | 2 refills | Status: AC

## 2025-02-01 NOTE — Assessment & Plan Note (Signed)
 See above management

## 2025-02-01 NOTE — Assessment & Plan Note (Signed)
 Thrombocytopenia in the setting of splenomegaly, 19 cm. Normal B12 level. Check CBC, folate, flow cytometry, LDH, HIV, hepatitis panel, BCR-ABL 1 FISH, JAK2 mutation with reflex, Monospot screening.

## 2025-02-01 NOTE — Assessment & Plan Note (Signed)
 Check peripheral blood flow cytometry, multiple myeloma panel, light chain ratio, LDH,

## 2025-02-01 NOTE — Progress Notes (Signed)
 " Hematology/Oncology Consult Note [phone] [phone]     REFERRING PROVIDER: Auston Reyes BIRCH, MD    CHIEF COMPLAINTS/PURPOSE OF CONSULTATION:  Anemia, lymphocytosis  ASSESSMENT & PLAN:   Thrombocytopenia Thrombocytopenia in the setting of splenomegaly, 19 cm. Normal B12 level. Check CBC, folate, flow cytometry, LDH, HIV, hepatitis panel, BCR-ABL 1 FISH, JAK2 mutation with reflex, Monospot screening.  Lymphocytosis Check peripheral blood flow cytometry, multiple myeloma panel, light chain ratio, LDH,  Splenomegaly See above management  Folate deficiency Folate level is slightly decreased.  Recommend patient to start on folic acid  supplementation.   Orders Placed This Encounter  Procedures   Folate    Standing Status:   Future    Number of Occurrences:   1    Expected Date:   02/01/2025    Expiration Date:   05/02/2025   CBC with Differential/Platelet    Standing Status:   Future    Number of Occurrences:   1    Expected Date:   02/01/2025    Expiration Date:   05/02/2025   Kappa/lambda light chains    Standing Status:   Future    Number of Occurrences:   1    Expected Date:   02/01/2025    Expiration Date:   05/02/2025   Multiple Myeloma Panel (SPEP&IFE w/QIG)    Standing Status:   Future    Number of Occurrences:   1    Expected Date:   02/01/2025    Expiration Date:   05/02/2025   Flow cytometry panel-leukemia/lymphoma work-up    Standing Status:   Future    Number of Occurrences:   1    Expected Date:   02/01/2025    Expiration Date:   05/02/2025   Lactate dehydrogenase    Standing Status:   Future    Number of Occurrences:   1    Expected Date:   02/01/2025    Expiration Date:   05/02/2025   HIV Antibody (routine testing w rflx)    Standing Status:   Future    Number of Occurrences:   1    Expected Date:   02/01/2025    Expiration Date:   05/02/2025   Hepatitis panel, acute    Standing Status:   Future    Number of Occurrences:   1    Expected  Date:   02/01/2025    Expiration Date:   05/02/2025   BCR-ABL1 FISH    Standing Status:   Future    Number of Occurrences:   1    Expected Date:   02/01/2025    Expiration Date:   05/02/2025   JAK2 V617F rfx CALR/MPL/E12-15    Standing Status:   Future    Number of Occurrences:   1    Expected Date:   02/01/2025    Expiration Date:   05/02/2025   Retic Panel    Standing Status:   Future    Number of Occurrences:   1    Expected Date:   02/01/2025    Expiration Date:   05/02/2025   Haptoglobin    Standing Status:   Future    Number of Occurrences:   1    Expected Date:   02/01/2025    Expiration Date:   05/02/2025   Mononucleosis screen    Standing Status:   Future    Number of Occurrences:   1    Expected Date:   02/01/2025    Expiration Date:  05/02/2025   Epstein-Barr virus VCA antibody panel    Standing Status:   Future    Expected Date:   02/01/2025    Expiration Date:   05/02/2025    All questions were answered. The patient knows to call the clinic with any problems, questions or concerns.  Zelphia Cap, MD, PhD [facility] Hematology Oncology 02/01/2025    HISTORY OF PRESENTING ILLNESS:  Adam Cline 64 y.o. male presents to establish care for anemia lymphocytosis  Discussed the use of AI scribe software for clinical note transcription with the patient, who gave verbal consent to proceed.   He was referred for hematology evaluation after laboratory studies revealed mild anemia, thrombocytopenia, and leukocytosis with lymphocytosis as the predominant abnormality. These findings have persisted for at least three weeks, with a trend of declining platelet counts since October 2025. Platelet counts were previously normal as recently as April 2025, and hemoglobin was normal ten months ago. Peripheral blood smear demonstrated atypical white blood cells. Iron studies, vitamin B12, and thyroid function were normal.  He describes a sensation of fullness and mild tenderness in the left upper quadrant, which  prompted imaging. CT scan in 2023 showed a normal spleen size. He denies unintentional weight loss, night sweats, fever, recent illness, nausea, vomiting, skin rash, joint pain, or stiffness. He has noticed increased muscle cramps over the past month. No occupational toxin exposure.  He reports intermittent left upper quadrant discomfort. He started DHEA supplementation for low energy approximately one year ago following prostatectomy. No new medications or over-the-counter supplements have been started recently. He has not received testosterone replacement, hormone therapy, or radiation therapy for prostate cancer.  He began a more physically demanding job in October 2025 and is on his feet more, which he associates with increased fatigue. No recent exposure to mononucleosis or symptoms consistent with acute EBV infection. Patient has a history of prostate cancer status post prostatectomy.  10/13 2025, PSA is less than 0.01.  09/26/2025, ultrasound abdomen showed splenomegaly.  Morphologic changes of liver which could reflect hepatic steatosis.   MEDICAL HISTORY:  Past Medical History:  Diagnosis Date   Arthritis    Benign essential HTN 12/31/2016   Benign prostatic hyperplasia without lower urinary tract symptoms 09/05/2020   Bilateral inguinal hernia    Bradycardia 05/26/2017   Coronary artery disease involving native coronary artery of native heart without angina pectoris    a.) lateral NSTEMI 12/22/2016 --> LHC 12/22/2016 --> PCI placing a DES (unknown type) to OM1   Elevated PSA 09/05/2020   Erectile dysfunction    a.) on PDE5i (sildenafil)   Hyperlipidemia, mixed 12/31/2016   Lateral NSTEMI (non-ST elevated myocardial infarction) (HCC) 12/22/2016   a.) LHC 12/22/2016 at [facility] in [place] (Dr. Tobie) --> PCI placing a DES (unknown type) to OM1   Prostate cancer (HCC) 11/13/2020   a.) Gleason 4+3=7 prostate cancer; pT2N0MxR0 with PNI; s/p RALP and bilateral PLND 03/12/21     SURGICAL HISTORY: Past Surgical History:  Procedure Laterality Date   COLONOSCOPY N/A 11/17/2022   Procedure: COLONOSCOPY;  Surgeon: Onita Elspeth Sharper, DO;  Location: [facility] ENDOSCOPY;  Service: Gastroenterology;  Laterality: N/A;   CORONARY ANGIOPLASTY WITH STENT PLACEMENT Left 12/22/2016   Procedure: CORONARY ANGIOPLASTY WITH STENT PLACEMENT; Location: [facility] in [place]; Surgeon: Tobie, MD   INGUINAL HERNIA REPAIR Bilateral 06/25/2022   Procedure: HERNIA REPAIR INGUINAL ADULT;  Surgeon: Dessa Reyes ORN, MD;  Location: [facility] ORS;  Service: General;  Laterality: Bilateral;  Shelba  Allred, RNFA to assist   LAPAROSCOPY W/ TOTAL BILATERAL PELVIC AND PERI-AORTIC LYMPHADENECTOMY N/A 03/02/2021   ROBOT ASSISTED LAPAROSCOPIC RADICAL PROSTATECTOMY N/A 03/02/2021    SOCIAL HISTORY: Social History   Socioeconomic History   Marital status: Married    Spouse name: Not on file   Number of children: Not on file   Years of education: Not on file   Highest education level: Not on file  Occupational History   Not on file  Tobacco Use   Smoking status: Never   Smokeless tobacco: Never  Vaping Use   Vaping status: Not on file  Substance and Sexual Activity   Alcohol use: Yes    Alcohol/week: 3.0 standard drinks of alcohol    Types: 3 Cans of beer per week    Comment: occ   Drug use: Never   Sexual activity: Not on file  Other Topics Concern   Not on file  Social History Narrative   Not on file   Social Drivers of Health   Tobacco Use: Low Risk (02/01/2025)   Patient History    Smoking Tobacco Use: Never    Smokeless Tobacco Use: Never    Passive Exposure: Not on file  Financial Resource Strain: Low Risk  (10/09/2024)   Received from [facility]   Overall Financial Resource Strain (CARDIA)    Difficulty of Paying Living Expenses: Not hard at all  Food Insecurity: No Food Insecurity (02/01/2025)   Epic    Worried About Running Out of Food in the  Last Year: Never true    Ran Out of Food in the Last Year: Never true  Transportation Needs: No Transportation Needs (02/01/2025)   Epic    Lack of Transportation (Medical): No    Lack of Transportation (Non-Medical): No  Physical Activity: Not on file  Stress: Not on file  Social Connections: Not on file  Intimate Partner Violence: Not At Risk (02/01/2025)   Epic    Fear of Current or Ex-Partner: No    Emotionally Abused: No    Physically Abused: No    Sexually Abused: No  Depression (PHQ2-9): Low Risk (02/01/2025)   Depression (PHQ2-9)    PHQ-2 Score: 0  Alcohol Screen: Not on file  Housing: Low Risk (02/01/2025)   Epic    Unable to Pay for Housing in the Last Year: No    Number of Times Moved in the Last Year: 0    Homeless in the Last Year: No  Utilities: Not At Risk (02/01/2025)   Epic    Threatened with loss of utilities: No  Health Literacy: Not on file    FAMILY HISTORY: Family History  Problem Relation Age of Onset   Hypertension Father    Melanoma Father    Cancer Sister        unknown   Kidney cancer Neg Hx    Bladder Cancer Neg Hx    Prostate cancer Neg Hx     ALLERGIES:  has no known allergies.  MEDICATIONS:  Current Outpatient Medications  Medication Sig Dispense Refill   Ascorbic Acid (VITAMIN C) 1000 MG tablet Take 2,000 mg by mouth daily.     aspirin EC 81 MG tablet 1 tablet once daily.     Cholecalciferol (VITAMIN D3 PO) Take 1 tablet by mouth daily at 6 (six) AM.     Coenzyme Q10 (CO Q 10 PO) Take 1 tablet by mouth daily at 6 (six) AM.     Collagen-Vitamin C (COLLAGEN PLUS  VITAMIN C PO) Take 1 Scoop by mouth daily at 6 (six) AM.     diclofenac Sodium (VOLTAREN) 1 % GEL Apply 4 g topically as needed.     ezetimibe (ZETIA) 10 MG tablet Take 10 mg by mouth every morning.     Ferrous Sulfate (IRON PO) Take 1 tablet by mouth daily.     Flaxseed, Linseed, (FLAXSEED OIL PO) Take 1 tablet by mouth daily at 6 (six) AM.     fluticasone (FLONASE) 50 MCG/ACT nasal  spray Place 2 sprays into both nostrils as needed.     GLUCOSAMINE-CHONDROITIN-MSM-D PO Take 1 tablet by mouth daily at 6 (six) AM.     lisinopril (PRINIVIL,ZESTRIL) 10 MG tablet Take 10 mg by mouth at bedtime.     MISC NATURAL PRODUCTS PO Take 1 tablet by mouth daily. DHEA     Psyllium (METAMUCIL PO) Take 1 Scoop by mouth daily at 6 (six) AM.     Sildenafil Citrate (VIAGRA PO) Take 1 tablet by mouth as needed.     TURMERIC CURCUMIN PO Take 1 tablet by mouth daily at 6 (six) AM.     No current facility-administered medications for this visit.    Review of Systems  Constitutional:  Positive for fatigue. Negative for appetite change, chills and fever.  HENT:   Negative for hearing loss and voice change.   Eyes:  Negative for eye problems.  Respiratory:  Negative for chest tightness and cough.   Cardiovascular:  Negative for chest pain.  Gastrointestinal:  Negative for abdominal distention, abdominal pain and blood in stool.  Endocrine: Negative for hot flashes.  Genitourinary:  Negative for difficulty urinating and frequency.   Musculoskeletal:  Negative for arthralgias.  Skin:  Negative for itching and rash.  Neurological:  Negative for extremity weakness.  Hematological:  Negative for adenopathy.  Psychiatric/Behavioral:  Negative for confusion.      PHYSICAL EXAMINATION: ECOG PERFORMANCE STATUS: 0 - Asymptomatic  Vitals:   02/01/25 0936  BP: 131/73  Pulse: 78  Resp: 18  Temp: (!) 97.3 F (36.3 C)   Filed Weights   02/01/25 0936  Weight: 184 lb 11.2 oz (83.8 kg)    Physical Exam Constitutional:      General: He is not in acute distress.    Appearance: He is not diaphoretic.  HENT:     Head: Normocephalic and atraumatic.  Eyes:     General: No scleral icterus. Cardiovascular:     Rate and Rhythm: Normal rate and regular rhythm.  Pulmonary:     Effort: Pulmonary effort is normal. No respiratory distress.     Breath sounds: No wheezing.  Abdominal:     General:  There is no distension.     Palpations: Abdomen is soft.     Tenderness: There is no abdominal tenderness.     Comments: Splenomegaly  Musculoskeletal:        General: Normal range of motion.     Cervical back: Normal range of motion and neck supple.  Skin:    General: Skin is warm and dry.     Findings: No erythema.  Neurological:     Mental Status: He is alert and oriented to person, place, and time. Mental status is at baseline.     Motor: No abnormal muscle tone.  Psychiatric:        Mood and Affect: Mood and affect normal.      LABORATORY DATA:  I have reviewed the data as listed  Latest Ref Rng & Units 02/01/2025   10:48 AM 01/19/2025    2:46 PM 01/11/2025   10:04 AM  CBC  WBC 4.0 - 10.5 K/uL 13.4  13.1  10.3   Hemoglobin 13.0 - 17.0 g/dL 87.9  87.7  88.0   Hematocrit 39.0 - 52.0 % 37.1  36.7  36.5   Platelets 150 - 400 K/uL 88  103  92       Latest Ref Rng & Units 12/03/2022   11:07 AM  CMP  Creatinine 0.61 - 1.24 mg/dL 8.89      RADIOGRAPHIC STUDIES: I have personally reviewed the radiological images as listed and agreed with the findings in the report. US  Abdomen Complete Result Date: 01/28/2025 CLINICAL DATA:  LUQ abd pain EXAM: ABDOMEN ULTRASOUND COMPLETE COMPARISON:  Fever 06/17/2022 FINDINGS: Gallbladder: No gallstones or wall thickening visualized. No sonographic Murphy sign noted by sonographer. Common bile duct: Diameter: Visualized portion measures 3 mm, within normal limits. Liver: No focal lesion identified. Mildly heterogeneous and overall increased in parenchymal echogenicity. Portal vein is patent on color Doppler imaging with normal direction of blood flow towards the liver. IVC: No abnormality visualized. Pancreas: Visualized portion unremarkable. Spleen: Spleen is enlarged spanning 19.3 by 9.4 by 17.0 cm for an estimated volume of 1,500 ML Right Kidney: Length: 10.5 cm. Echogenicity within normal limits. No mass or hydronephrosis visualized. Left  Kidney: Length: 12 cm. Echogenicity within normal limits. No mass or hydronephrosis visualized. Abdominal aorta: No aneurysm visualized. Other findings: None. IMPRESSION: 1. Splenomegaly. 2. Morphologic changes of the liver which could reflect hepatic steatosis. Electronically Signed   By: Corean Salter M.D.   On: 01/28/2025 15:07    "

## 2025-02-01 NOTE — Assessment & Plan Note (Signed)
 Folate level is slightly decreased.  Recommend patient to start on folic acid  supplementation.

## 2025-02-02 LAB — KAPPA/LAMBDA LIGHT CHAINS
Kappa free light chain: 20.1 mg/L — ABNORMAL HIGH (ref 3.3–19.4)
Kappa, lambda light chain ratio: 0.61 (ref 0.26–1.65)
Lambda free light chains: 33 mg/L — ABNORMAL HIGH (ref 5.7–26.3)

## 2025-02-02 LAB — HAPTOGLOBIN: Haptoglobin: 207 mg/dL (ref 32–363)

## 2025-02-03 LAB — COMP PANEL: LEUKEMIA/LYMPHOMA

## 2025-02-03 LAB — MULTIPLE MYELOMA PANEL, SERUM
Albumin SerPl Elph-Mcnc: 3.7 g/dL (ref 2.9–4.4)
Albumin/Glob SerPl: 1.3 (ref 0.7–1.7)
Alpha 1: 0.2 g/dL (ref 0.0–0.4)
Alpha2 Glob SerPl Elph-Mcnc: 0.7 g/dL (ref 0.4–1.0)
B-Globulin SerPl Elph-Mcnc: 0.9 g/dL (ref 0.7–1.3)
Gamma Glob SerPl Elph-Mcnc: 1.1 g/dL (ref 0.4–1.8)
Globulin, Total: 2.9 g/dL (ref 2.2–3.9)
IgA: 127 mg/dL (ref 61–437)
IgG (Immunoglobin G), Serum: 1172 mg/dL (ref 603–1613)
IgM (Immunoglobulin M), Srm: 64 mg/dL (ref 20–172)
Total Protein ELP: 6.6 g/dL (ref 6.0–8.5)

## 2025-03-03 ENCOUNTER — Inpatient Hospital Stay: Admitting: Oncology

## 2025-03-07 ENCOUNTER — Other Ambulatory Visit
